# Patient Record
Sex: Male | Born: 1990 | Race: White | Hispanic: No | Smoking: Former smoker
Health system: Southern US, Community
[De-identification: ages and names within clinical notes are randomized; demographics above are authoritative.]

## PROBLEM LIST (undated history)

## (undated) DIAGNOSIS — T7840XA Allergy, unspecified, initial encounter: Secondary | ICD-10-CM

## (undated) DIAGNOSIS — J45909 Unspecified asthma, uncomplicated: Secondary | ICD-10-CM

## (undated) DIAGNOSIS — I10 Essential (primary) hypertension: Secondary | ICD-10-CM

## (undated) HISTORY — DX: Essential (primary) hypertension: I10

## (undated) HISTORY — DX: Allergy, unspecified, initial encounter: T78.40XA

## (undated) HISTORY — PX: FINGER SURGERY: SHX640

## (undated) HISTORY — DX: Unspecified asthma, uncomplicated: J45.909

---

## 2014-08-30 ENCOUNTER — Ambulatory Visit (INDEPENDENT_AMBULATORY_CARE_PROVIDER_SITE_OTHER): Payer: BLUE CROSS/BLUE SHIELD | Admitting: Urgent Care

## 2014-08-30 VITALS — BP 134/80 | HR 74 | Temp 98.5°F | Resp 16 | Ht 70.5 in | Wt 255.2 lb

## 2014-08-30 DIAGNOSIS — L550 Sunburn of first degree: Secondary | ICD-10-CM | POA: Diagnosis not present

## 2014-08-30 DIAGNOSIS — R208 Other disturbances of skin sensation: Secondary | ICD-10-CM | POA: Diagnosis not present

## 2014-08-30 DIAGNOSIS — L551 Sunburn of second degree: Secondary | ICD-10-CM | POA: Diagnosis not present

## 2014-08-30 NOTE — Patient Instructions (Signed)
Sunburn Sunburn is damage to the skin caused by overexposure to ultraviolet (UV) rays. People with light skin or a fair complexion may be more susceptible to sunburn. Repeated sun exposure causes early skin aging such as wrinkles and sun spots. It also increases the risk of skin cancer. CAUSES A sunburn is caused by getting too much UV radiation from the sun. SYMPTOMS  Red or pink skin.  Soreness and swelling.  Pain.  Blisters.  Peeling skin.  Headache, fever, and fatigue if sunburn covers a large area. TREATMENT  Your caregiver may tell you to take certain medicines to lessen inflammation.  Your caregiver may have you use hydrocortisone cream or spray to help with itching and inflammation.  Your caregiver may prescribe an antibiotic cream to use on blisters. HOME CARE INSTRUCTIONS   Avoid further exposure to the sun.  Cool baths and cool compresses may be helpful if used several times per day. Do not apply ice, since this may result in more damage to the skin.  Only take over-the-counter or prescription medicines for pain, discomfort, or fever as directed by your caregiver.  Use aloe or other over-the-counter sunburn creams or gels on your skin. Do not apply these creams or gels on blisters.  Drink enough fluids to keep your urine clear or pale yellow.  Do not break blisters. If blisters break, your caregiver may recommend an antibiotic cream to apply to the affected area. PREVENTION   Try to avoid the sun between 10:00 a.m. and 4:00 p.m. when it is the strongest.  Apply sunscreen at least 30 minutes before exposure to the sun.  Always wear protective hats, clothing, and sunglasses with UV protection.  Avoid medicines, herbs, and foods that increase your sensitivity to sunlight.  Avoid tanning beds. SEEK IMMEDIATE MEDICAL CARE IF:   You have a fever.  Your pain is uncontrolled with medicine.  You start to vomit or have diarrhea.  You feel faint or develop a  headache with confusion.  You develop severe blistering.  You have a pus-like (purulent) discharge coming from the blisters.  Your burn becomes more painful and swollen. MAKE SURE YOU:  Understand these instructions.  Will watch your condition.  Will get help right away if you are not doing well or get worse. Document Released: 12/12/2004 Document Revised: 06/29/2012 Document Reviewed: 08/26/2010 [employer] Patient Information 2015 [employer]. This information is not intended to replace advice given to you by your health care provider. Make sure you discuss any questions you have with your health care provider.  

## 2014-08-30 NOTE — Progress Notes (Signed)
    MRN: [id number] DOB: 01/23/1991  Subjective:   Antonio Kelley is a 24 y.o. male presenting for chief complaint of Sunburn  Reports 4 day history of sunburn. [place] and did not use sun block. Has tried aloe vera with minimal relief. Denies fevers, blisters, bleeding, chills, numbness, tingling, decreased range of motion. Patient has not suffered a sunburn like this previously. Denies any other aggravating or relieving factors, no other questions or concerns.  Wilgus currently has no medications in their medication list. He has No Known Allergies.  Tyrees  has a past medical history of Hypertension. Also  has past surgical history that includes Finger surgery.  ROS As in subjective.  Objective:   Vitals: BP 134/80 mmHg  Pulse 74  Temp(Src) 98.5 F (36.9 C) (Oral)  Resp 16  Ht 5' 10.5" (1.791 m)  Wt 255 lb 4 oz (115.781 kg)  BMI 36.09 kg/m2  SpO2 99%  Physical Exam  Constitutional: He is oriented to person, place, and time. He appears well-developed and well-nourished.  Cardiovascular: Normal rate.   Pulmonary/Chest: Effort normal.  Neurological: He is alert and oriented to person, place, and time.  Skin:       Assessment and Plan :   1. Sunburn, blistering 2. Sunburn of first degree 3. Pain of skin - Applied sulfadiazine to affect areas, recommended that patient continue to use cooled aloe vera. Also counseled patient on a first degree sunburn. Advised that he not remove any blistering that may still occur. Followup as needed.  Wallis Bamberg, PA-C [facility] [phone] 08/30/2014 1:48 PM

## 2014-11-03 ENCOUNTER — Ambulatory Visit (INDEPENDENT_AMBULATORY_CARE_PROVIDER_SITE_OTHER): Payer: BLUE CROSS/BLUE SHIELD

## 2014-11-03 ENCOUNTER — Ambulatory Visit (INDEPENDENT_AMBULATORY_CARE_PROVIDER_SITE_OTHER): Payer: BLUE CROSS/BLUE SHIELD | Admitting: Family Medicine

## 2014-11-03 VITALS — BP 138/84 | HR 94 | Temp 98.1°F | Resp 18 | Ht 74.0 in | Wt 253.0 lb

## 2014-11-03 DIAGNOSIS — R1013 Epigastric pain: Secondary | ICD-10-CM | POA: Diagnosis not present

## 2014-11-03 DIAGNOSIS — M25531 Pain in right wrist: Secondary | ICD-10-CM

## 2014-11-03 DIAGNOSIS — K219 Gastro-esophageal reflux disease without esophagitis: Secondary | ICD-10-CM | POA: Diagnosis not present

## 2014-11-03 LAB — POCT CBC
Granulocyte percent: 61.5 %G (ref 37–80)
HEMATOCRIT: 49.9 % (ref 43.5–53.7)
HEMOGLOBIN: 15.9 g/dL (ref 14.1–18.1)
LYMPH, POC: 3.1 (ref 0.6–3.4)
MCH: 27.3 pg (ref 27–31.2)
MCHC: 31.9 g/dL (ref 31.8–35.4)
MCV: 85.7 fL (ref 80–97)
MID (cbc): 1 — AB (ref 0–0.9)
MPV: 8.5 fL (ref 0–99.8)
POC GRANULOCYTE: 6.6 (ref 2–6.9)
POC LYMPH PERCENT: 28.9 %L (ref 10–50)
POC MID %: 9.6 % (ref 0–12)
Platelet Count, POC: 218 10*3/uL (ref 142–424)
RBC: 5.83 M/uL (ref 4.69–6.13)
RDW, POC: 13.5 %
WBC: 10.7 10*3/uL — AB (ref 4.6–10.2)

## 2014-11-03 MED ORDER — DICLOFENAC SODIUM 75 MG PO TBEC: 75.0000 mg | DELAYED_RELEASE_TABLET | Freq: Two times a day (BID) | ORAL | Status: DC

## 2014-11-03 MED ORDER — PANTOPRAZOLE SODIUM 40 MG PO TBEC: 40.0000 mg | DELAYED_RELEASE_TABLET | Freq: Every day | ORAL | Status: DC

## 2014-11-03 NOTE — Progress Notes (Signed)
  Subjective:  Patient ID: Antonio Kelley, male    DOB: 1990-07-15  Age: 24 y.o. MRN: [id number]  Has been hurting for the past couple of weeks in his right wrist. Nose major injuries. He does a lot of lifting of furniture.  Patient is also been having a chronic problem with dyspepsia. He's not been on any long-term regular medication. He has never been treated for H. pylori. He hurts in his epigastrium and has reflux nighttime. Very little relief with taking Tums occasionally.  Objective:   Pleasant gentleman in no major distress. Mild tenderness in the lateral/ulnar aspect of the right wrist.  [facility] reading (PRIMARY) by  Dr. Alwyn Ren Normal wrist.  .  Chest clear. Heart regular without murmurs. Abdomen soft without mass or tenderness    Assessment & Plan:   Assessment: Chronic dyspepsia, rule out H. pylori Wrist pain, probably an overuse strain.   Plan:  Wear a wrist splint. Diclofenac twice daily Pantoprazole 40 daily for stomach Return if not improving  Patient Instructions  Take diclofenac one twice daily with breakfast and supper for inflammation in wrist  Wear the wrist splint most of the time for several weeks and see if just resting it will not allow it to heal back up. If not improving we may need to send you to an orthopedist for a second opinion  Take the pantoprazole one daily for stomach, best taken before bedtime  If stomach keeps bothering you please return and we will decide on sending you elsewhere for a scoping  If the H. pylori test comes back positive you'll be given a couple of additional medications for about 10 days  Return as needed       Zade Falkner, MD 11/03/2014

## 2014-11-03 NOTE — Patient Instructions (Signed)
Take diclofenac one twice daily with breakfast and supper for inflammation in wrist  Wear the wrist splint most of the time for several weeks and see if just resting it will not allow it to heal back up. If not improving we may need to send you to an orthopedist for a second opinion  Take the pantoprazole one daily for stomach, best taken before bedtime  If stomach keeps bothering you please return and we will decide on sending you elsewhere for a scoping  If the H. pylori test comes back positive you'll be given a couple of additional medications for about 10 days  Return as needed

## 2014-11-04 LAB — H. PYLORI BREATH TEST: H. pylori Breath Test: DETECTED — AB

## 2014-11-10 ENCOUNTER — Other Ambulatory Visit: Payer: Self-pay | Admitting: Family Medicine

## 2014-11-10 MED ORDER — AMOXICILLIN 500 MG PO CAPS: ORAL_CAPSULE | ORAL | Status: DC

## 2014-11-10 MED ORDER — CLARITHROMYCIN ER 500 MG PO TB24: ORAL_TABLET | ORAL | Status: DC

## 2014-11-24 ENCOUNTER — Ambulatory Visit (INDEPENDENT_AMBULATORY_CARE_PROVIDER_SITE_OTHER): Payer: BLUE CROSS/BLUE SHIELD

## 2014-11-24 ENCOUNTER — Ambulatory Visit (INDEPENDENT_AMBULATORY_CARE_PROVIDER_SITE_OTHER): Payer: BLUE CROSS/BLUE SHIELD | Admitting: Family Medicine

## 2014-11-24 VITALS — BP 120/80 | HR 90 | Temp 98.0°F | Resp 18 | Ht 72.0 in | Wt 254.0 lb

## 2014-11-24 DIAGNOSIS — R05 Cough: Secondary | ICD-10-CM | POA: Diagnosis not present

## 2014-11-24 DIAGNOSIS — R062 Wheezing: Secondary | ICD-10-CM

## 2014-11-24 DIAGNOSIS — J4521 Mild intermittent asthma with (acute) exacerbation: Secondary | ICD-10-CM | POA: Diagnosis not present

## 2014-11-24 DIAGNOSIS — R059 Cough, unspecified: Secondary | ICD-10-CM

## 2014-11-24 DIAGNOSIS — K219 Gastro-esophageal reflux disease without esophagitis: Secondary | ICD-10-CM

## 2014-11-24 DIAGNOSIS — J309 Allergic rhinitis, unspecified: Secondary | ICD-10-CM | POA: Diagnosis not present

## 2014-11-24 MED ORDER — PANTOPRAZOLE SODIUM 40 MG PO TBEC: 40.0000 mg | DELAYED_RELEASE_TABLET | Freq: Every day | ORAL | Status: DC

## 2014-11-24 MED ORDER — ALBUTEROL SULFATE (2.5 MG/3ML) 0.083% IN NEBU
2.5000 mg | INHALATION_SOLUTION | Freq: Once | RESPIRATORY_TRACT | Status: AC
  Administered 2014-11-24: 2.5 mg via RESPIRATORY_TRACT

## 2014-11-24 MED ORDER — MOMETASONE FUROATE 100 MCG/ACT IN AERO: 2.0000 | INHALATION_SPRAY | Freq: Two times a day (BID) | RESPIRATORY_TRACT | Status: DC

## 2014-11-24 MED ORDER — IPRATROPIUM BROMIDE 0.02 % IN SOLN
0.5000 mg | Freq: Once | RESPIRATORY_TRACT | Status: AC
  Administered 2014-11-24: 0.5 mg via RESPIRATORY_TRACT

## 2014-11-24 MED ORDER — ALBUTEROL SULFATE 108 (90 BASE) MCG/ACT IN AEPB: 1.0000 | INHALATION_SPRAY | RESPIRATORY_TRACT | Status: DC | PRN

## 2014-11-24 NOTE — Progress Notes (Signed)
Subjective:  This chart was scribed for Antonio Post, MD by Broadus John, Medical Scribe. This patient was seen in Room 2 and the patient's care was started at 9:38 AM.   Patient ID: Antonio Kelley, male    DOB: 11-Jul-1990, 24 y.o.   MRN: [id number]  Chief Complaint  Patient presents with  . Shortness of Breath    x 2 weeks  . Wheezing    x 2 weeks  . Cough    x 2weeks   HPI HPI Comments: Antonio Kelley is a 24 y.o. male who presents to [facility] complaining of a worsening shortness of breath, gradual onset 2 weeks ago.   Pt reports associated symptoms of wheezing, unproductive cough.  Pt has not tried any OTC medications for the symptoms. He indicates that he has not has any new contacts with new pets, or in sick environment. Pt denies fever, diaphoresis, and sore throat. Pt reports a past medical history of Asthma while playing sports for which he used an inhaler, however he has not had the need to use it since high school. Pt reports having a history of allergies that result of constant symptoms of rhinorrhea.  Pt was seen here on 12/04/14 for heart burn symptoms for which he tested positive for H Pylori. Pt was started on Amoxicillin and Biaxin, increase protonix to BID for 10 days. Pt reports that he has had minimal heart burn since finishing the course of protonix, which he reports his worsening wheezing symptoms might has been associated with it.   Pt works at [employer].    There are no active problems to display for this patient.  Past Medical History  Diagnosis Date  . Hypertension   . Allergy    Past Surgical History  Procedure Laterality Date  . Finger surgery     No Known Allergies Prior to Admission medications   Medication Sig Start Date End Date Taking? Authorizing Provider  amoxicillin (AMOXIL) 500 MG capsule 2 pills twice daily Patient not taking: Reported on 11/24/2014 11/10/14   Peyton Najjar, MD  clarithromycin (BIAXIN XL)  500 MG 24 hr tablet 1 pill twice daily Patient not taking: Reported on 11/24/2014 11/10/14   Peyton Najjar, MD  diclofenac (VOLTAREN) 75 MG EC tablet Take 1 tablet (75 mg total) by mouth 2 (two) times daily. Patient not taking: Reported on 11/24/2014 11/03/14   Peyton Najjar, MD  pantoprazole (PROTONIX) 40 MG tablet Take 1 tablet (40 mg total) by mouth daily. Patient not taking: Reported on 11/24/2014 11/03/14   Peyton Najjar, MD   Social History   Social History  . Marital Status: Single    Spouse Name: N/A  . Number of Children: N/A  . Years of Education: N/A   Occupational History  . Not on file.   Social History Main Topics  . Smoking status: Former Games developer  . Smokeless tobacco: Former Neurosurgeon  . Alcohol Use: No  . Drug Use: No  . Sexual Activity: Not on file   Other Topics Concern  . Not on file   Social History Narrative    Review of Systems  Constitutional: Negative for fever and diaphoresis.  HENT: Positive for rhinorrhea. Negative for sore throat.   Respiratory: Positive for cough, shortness of breath and wheezing.       Objective:   Physical Exam  Constitutional: He is oriented to person, place, and time. He appears well-developed and well-nourished. No distress.  HENT:  Head: Normocephalic and atraumatic.  Eyes: EOM are normal. Pupils are equal, round, and reactive to light.  Neck: Neck supple. No JVD present. Carotid bruit is not present.  Cardiovascular: Normal rate, regular rhythm and normal heart sounds.   No murmur heard. Pulmonary/Chest: Effort normal. No respiratory distress. He has wheezes (Diffuses wheeze on expiration, slight on inspiration.). He has no rales.  Normal effort.   Musculoskeletal: He exhibits no edema.  Neurological: He is alert and oriented to person, place, and time. No cranial nerve deficit.  Skin: Skin is warm and dry.  Psychiatric: He has a normal mood and affect. His behavior is normal.  Nursing note and vitals reviewed.   Filed  Vitals:   11/24/14 0925  BP: 120/80  Pulse: 90  Temp: 98 F (36.7 C)  TempSrc: Oral  Resp: 18  Height: 6' (1.829 m)  Weight: 254 lb (115.214 kg)  SpO2: 97%   Peak flow reading is 350, about 55 % of predicted (after predicted 636).  10:14 AM- After albuterol nep treatment, slight improvement in breathing, but still wheezing inspiratory and expiratory.  Peak flow was after first nep treatment was 450.  11:19 AM- Second albuterol and atrovent nep given, had improved breathing but still diffused wheeze. Repeat albuterol and atrovent nep given, he has improved aeration, but still slight on expiratory wheeze, but normal effort. Peak flow is 590.     [facility] (PRIMARY) x-ray report read by Dr. Trinna Kelley- no acute disease.       Assessment & Plan:   [name] is a 24 y.o. male Gastroesophageal reflux disease without esophagitis - Plan: pantoprazole (PROTONIX) 40 MG tablet  Wheezing - Plan: albuterol (PROVENTIL) (2.5 MG/3ML) 0.083% nebulizer solution 2.5 mg, DG Chest 2 View  Cough - Plan: DG Chest 2 View  Allergic rhinitis, unspecified allergic rhinitis type  Asthma, intermittent, with acute exacerbation - Plan: albuterol (PROVENTIL) (2.5 MG/3ML) 0.083% nebulizer solution 2.5 mg, albuterol (PROVENTIL) (2.5 MG/3ML) 0.083% nebulizer solution 2.5 mg, ipratropium (ATROVENT) nebulizer solution 0.5 mg, albuterol (PROVENTIL) (2.5 MG/3ML) 0.083% nebulizer solution 2.5 mg, ipratropium (ATROVENT) nebulizer solution 0.5 mg, DG Chest 2 View, Albuterol Sulfate (PROAIR RESPICLICK) 108 (90 BASE) MCG/ACT AEPB, Mometasone Furoate (ASMANEX HFA) 100 MCG/ACT AERO   -Suspected underlying asthma, with recent wheezing. Improved with nebulizer treatments in office. Start Asmanex twice a day, albuterol at home if needed. If increased need for albuterol, or worsening symptoms in the next few days, return to clinic or emergency room. Held on prednisone taper at this point as may be awakened by with just inhaled  corticosteroid. RTC precautions discussed.  Additionally discuss continuing Protonix for possible GERD component, and over-the-counter antihistamine for allergy symptoms.  Meds ordered this encounter  Medications  . pantoprazole (PROTONIX) 40 MG tablet    Sig: Take 1 tablet (40 mg total) by mouth daily.    Dispense:  30 tablet    Refill:  1  . albuterol (PROVENTIL) (2.5 MG/3ML) 0.083% nebulizer solution 2.5 mg    Sig:   . albuterol (PROVENTIL) (2.5 MG/3ML) 0.083% nebulizer solution 2.5 mg    Sig:   . ipratropium (ATROVENT) nebulizer solution 0.5 mg    Sig:   . albuterol (PROVENTIL) (2.5 MG/3ML) 0.083% nebulizer solution 2.5 mg    Sig:   . ipratropium (ATROVENT) nebulizer solution 0.5 mg    Sig:   . Albuterol Sulfate (PROAIR RESPICLICK) 108 (90 BASE) MCG/ACT AEPB    Sig: Inhale 1-2 puffs into the lungs every 4 (  four) hours as needed.    Dispense:  1 each    Refill:  0  . Mometasone Furoate (ASMANEX HFA) 100 MCG/ACT AERO    Sig: Inhale 2 puffs into the lungs 2 (two) times daily.    Dispense:  13 g    Refill:  0   Patient Instructions  You're breathing has significantly improved here in the office with the albuterol and Atrovent nebs. I wrote for a similar medication called pro-air. You can use 1-2 puffs every 4 hours as needed over the next few days, if you're still using this frequently into this weekend, follow up with me on Saturday or Sunday. We are open from 8 to for both days. Additionally start the Asmanex inhaler 2 puffs every 12 hours. If wheezing is not improving in the next 2 days, we may need to start prednisone by mouth, but can see if the Asmanex will treat her symptoms initially. If you are having any worsening of your symptoms, or requiring albuterol inhaler more frequent every 4 hours, return here or the emergency room.  Continue over-the-counter Claritin, Allegra, or Zyrtec for allergies, and I sent in more of your Protonix to treat the acid reflux symptoms. Reflux can  also contribute to wheezing, so continue the acid blocker for now. Follow-up with myself or other provider in next 2-3 weeks to see how both the reflux and your wheezing symptoms have been doing.  Return to the clinic or go to the nearest emergency room if any of your symptoms worsen or new symptoms occur.  Bronchospasm A bronchospasm is when the tubes that carry air in and out of your lungs (airways) spasm or tighten. During a bronchospasm it is hard to breathe. This is because the airways get smaller. A bronchospasm can be triggered by:  Allergies. These may be to animals, pollen, food, or mold.  Infection. This is a common cause of bronchospasm.  Exercise.  Irritants. These include pollution, cigarette smoke, strong odors, aerosol sprays, and paint fumes.  Weather changes.  Stress.  Being emotional. HOME CARE   Always have a plan for getting help. Know when to call your doctor and local emergency services (911 in the [place] Know where you can get emergency care.  Only take medicines as told by your doctor.  If you were prescribed an inhaler or nebulizer machine, ask your doctor how to use it correctly. Always use a spacer with your inhaler if you were given one.  Stay calm during an attack. Try to relax and breathe more slowly.  Control your home environment:  Change your heating and air conditioning filter at least once a month.  Limit your use of fireplaces and wood stoves.  Do not  smoke. Do not  allow smoking in your home.  Avoid perfumes and fragrances.  Get rid of pests (such as roaches and mice) and their droppings.  Throw away plants if you see mold on them.  Keep your house clean and dust free.  Replace carpet with wood, tile, or vinyl flooring. Carpet can trap dander and dust.  Use allergy-proof pillows, mattress covers, and box spring covers.  Wash bed sheets and blankets every week in hot water. Dry them in a dryer.  Use blankets that are made of  polyester or cotton.  Wash hands frequently. GET HELP IF:  You have muscle aches.  You have chest pain.  The thick spit you spit or cough up (sputum) changes from clear or white to yellow, green,  gray, or bloody.  The thick spit you spit or cough up gets thicker.  There are problems that may be related to the medicine you are given such as:  A rash.  Itching.  Swelling.  Trouble breathing. GET HELP RIGHT AWAY IF:  You feel you cannot breathe or catch your breath.  You cannot stop coughing.  Your treatment is not helping you breathe better.  You have very bad chest pain. MAKE SURE YOU:   Understand these instructions.  Will watch your condition.  Will get help right away if you are not doing well or get worse. Document Released: 12/30/2008 Document Revised: 03/09/2013 Document Reviewed: 08/25/2012 [facility] Patient Information 2015 [place], [place]. This information is not intended to replace advice given to you by your health care provider. Make sure you discuss any questions you have with your health care provider.   Asthma, Acute Bronchospasm Acute bronchospasm caused by asthma is also referred to as an asthma attack. Bronchospasm means your air passages become narrowed. The narrowing is caused by inflammation and tightening of the muscles in the air tubes (bronchi) in your lungs. This can make it hard to breathe or cause you to wheeze and cough. CAUSES Possible triggers are:  Animal dander from the skin, hair, or feathers of animals.  Dust mites contained in house dust.  Cockroaches.  Pollen from trees or grass.  Mold.  Cigarette or tobacco smoke.  Air pollutants such as dust, household cleaners, hair sprays, aerosol sprays, paint fumes, strong chemicals, or strong odors.  Cold air or weather changes. Cold air may trigger inflammation. Winds increase molds and pollens in the air.  Strong emotions such as crying or laughing hard.  Stress.  Certain  medicines such as aspirin or beta-blockers.  Sulfites in foods and drinks, such as dried fruits and wine.  Infections or inflammatory conditions, such as a flu, cold, or inflammation of the nasal membranes (rhinitis).  Gastroesophageal reflux disease (GERD). GERD is a condition where stomach acid backs up into your esophagus.  Exercise or strenuous activity. SIGNS AND SYMPTOMS   Wheezing.  Excessive coughing, particularly at night.  Chest tightness.  Shortness of breath. DIAGNOSIS  Your health care provider will ask you about your medical history and perform a physical exam. A chest X-ray or blood testing may be performed to look for other causes of your symptoms or other conditions that may have triggered your asthma attack. TREATMENT  Treatment is aimed at reducing inflammation and opening up the airways in your lungs. Most asthma attacks are treated with inhaled medicines. These include quick relief or rescue medicines (such as bronchodilators) and controller medicines (such as inhaled corticosteroids). These medicines are sometimes given through an inhaler or a nebulizer. Systemic steroid medicine taken by mouth or given through an IV tube also can be used to reduce the inflammation when an attack is moderate or severe. Antibiotic medicines are only used if a bacterial infection is present.  HOME CARE INSTRUCTIONS   Rest.  Drink plenty of liquids. This helps the mucus to remain thin and be easily coughed up. Only use caffeine in moderation and do not use alcohol until you have recovered from your illness.  Do not smoke. Avoid being exposed to secondhand smoke.  You play a critical role in keeping yourself in good health. Avoid exposure to things that cause you to wheeze or to have breathing problems.  Keep your medicines up-to-date and available. Carefully follow your health care provider's treatment plan.  Take  your medicine exactly as prescribed.  When pollen or pollution  is bad, keep windows closed and use an air conditioner or go to places with air conditioning.  Asthma requires careful medical care. See your health care provider for a follow-up as advised. If you are more than [redacted] weeks pregnant and you were prescribed any new medicines, let your obstetrician know about the visit and how you are doing. Follow up with your health care provider as directed.  After you have recovered from your asthma attack, make an appointment with your outpatient doctor to talk about ways to reduce the likelihood of future attacks. If you do not have a doctor who manages your asthma, make an appointment with a primary care doctor to discuss your asthma. SEEK IMMEDIATE MEDICAL CARE IF:   You are getting worse.  You have trouble breathing. If severe, call your local emergency services (911 in the [place]  You develop chest pain or discomfort.  You are vomiting.  You are not able to keep fluids down.  You are coughing up yellow, green, brown, or bloody sputum.  You have a fever and your symptoms suddenly get worse.  You have trouble swallowing. MAKE SURE YOU:   Understand these instructions.  Will watch your condition.  Will get help right away if you are not doing well or get worse. Document Released: 06/19/2006 Document Revised: 03/09/2013 Document Reviewed: 09/09/2012 [facility] Patient Information 2015 [place], [place]. This information is not intended to replace advice given to you by your health care provider. Make sure you discuss any questions you have with your health care provider.     I personally performed the services described in this documentation, which was scribed in my presence. The recorded information has been reviewed and considered, and addended by me as needed.

## 2014-11-24 NOTE — Patient Instructions (Signed)
You're breathing has significantly improved here in the office with the albuterol and Atrovent nebs. I wrote for a similar medication called pro-air. You can use 1-2 puffs every 4 hours as needed over the next few days, if you're still using this frequently into this weekend, follow up with me on Saturday or Sunday. We are open from 8 to for both days. Additionally start the Asmanex inhaler 2 puffs every 12 hours. If wheezing is not improving in the next 2 days, we may need to start prednisone by mouth, but can see if the Asmanex will treat her symptoms initially. If you are having any worsening of your symptoms, or requiring albuterol inhaler more frequent every 4 hours, return here or the emergency room.  Continue over-the-counter Claritin, Allegra, or Zyrtec for allergies, and I sent in more of your Protonix to treat the acid reflux symptoms. Reflux can also contribute to wheezing, so continue the acid blocker for now. Follow-up with myself or other provider in next 2-3 weeks to see how both the reflux and your wheezing symptoms have been doing.  Return to the clinic or go to the nearest emergency room if any of your symptoms worsen or new symptoms occur.  Bronchospasm A bronchospasm is when the tubes that carry air in and out of your lungs (airways) spasm or tighten. During a bronchospasm it is hard to breathe. This is because the airways get smaller. A bronchospasm can be triggered by:  Allergies. These may be to animals, pollen, food, or mold.  Infection. This is a common cause of bronchospasm.  Exercise.  Irritants. These include pollution, cigarette smoke, strong odors, aerosol sprays, and paint fumes.  Weather changes.  Stress.  Being emotional. HOME CARE   Always have a plan for getting help. Know when to call your doctor and local emergency services (911 in the [place] Know where you can get emergency care.  Only take medicines as told by your doctor.  If you were prescribed an  inhaler or nebulizer machine, ask your doctor how to use it correctly. Always use a spacer with your inhaler if you were given one.  Stay calm during an attack. Try to relax and breathe more slowly.  Control your home environment:  Change your heating and air conditioning filter at least once a month.  Limit your use of fireplaces and wood stoves.  Do not  smoke. Do not  allow smoking in your home.  Avoid perfumes and fragrances.  Get rid of pests (such as roaches and mice) and their droppings.  Throw away plants if you see mold on them.  Keep your house clean and dust free.  Replace carpet with wood, tile, or vinyl flooring. Carpet can trap dander and dust.  Use allergy-proof pillows, mattress covers, and box spring covers.  Wash bed sheets and blankets every week in hot water. Dry them in a dryer.  Use blankets that are made of polyester or cotton.  Wash hands frequently. GET HELP IF:  You have muscle aches.  You have chest pain.  The thick spit you spit or cough up (sputum) changes from clear or white to yellow, green, gray, or bloody.  The thick spit you spit or cough up gets thicker.  There are problems that may be related to the medicine you are given such as:  A rash.  Itching.  Swelling.  Trouble breathing. GET HELP RIGHT AWAY IF:  You feel you cannot breathe or catch your breath.  You cannot stop  coughing.  Your treatment is not helping you breathe better.  You have very bad chest pain. MAKE SURE YOU:   Understand these instructions.  Will watch your condition.  Will get help right away if you are not doing well or get worse. Document Released: 12/30/2008 Document Revised: 03/09/2013 Document Reviewed: 08/25/2012 [facility] Patient Information 2015 [place], [place]. This information is not intended to replace advice given to you by your health care provider. Make sure you discuss any questions you have with your health care provider.   Asthma,  Acute Bronchospasm Acute bronchospasm caused by asthma is also referred to as an asthma attack. Bronchospasm means your air passages become narrowed. The narrowing is caused by inflammation and tightening of the muscles in the air tubes (bronchi) in your lungs. This can make it hard to breathe or cause you to wheeze and cough. CAUSES Possible triggers are:  Animal dander from the skin, hair, or feathers of animals.  Dust mites contained in house dust.  Cockroaches.  Pollen from trees or grass.  Mold.  Cigarette or tobacco smoke.  Air pollutants such as dust, household cleaners, hair sprays, aerosol sprays, paint fumes, strong chemicals, or strong odors.  Cold air or weather changes. Cold air may trigger inflammation. Winds increase molds and pollens in the air.  Strong emotions such as crying or laughing hard.  Stress.  Certain medicines such as aspirin or beta-blockers.  Sulfites in foods and drinks, such as dried fruits and wine.  Infections or inflammatory conditions, such as a flu, cold, or inflammation of the nasal membranes (rhinitis).  Gastroesophageal reflux disease (GERD). GERD is a condition where stomach acid backs up into your esophagus.  Exercise or strenuous activity. SIGNS AND SYMPTOMS   Wheezing.  Excessive coughing, particularly at night.  Chest tightness.  Shortness of breath. DIAGNOSIS  Your health care provider will ask you about your medical history and perform a physical exam. A chest X-ray or blood testing may be performed to look for other causes of your symptoms or other conditions that may have triggered your asthma attack. TREATMENT  Treatment is aimed at reducing inflammation and opening up the airways in your lungs. Most asthma attacks are treated with inhaled medicines. These include quick relief or rescue medicines (such as bronchodilators) and controller medicines (such as inhaled corticosteroids). These medicines are sometimes given  through an inhaler or a nebulizer. Systemic steroid medicine taken by mouth or given through an IV tube also can be used to reduce the inflammation when an attack is moderate or severe. Antibiotic medicines are only used if a bacterial infection is present.  HOME CARE INSTRUCTIONS   Rest.  Drink plenty of liquids. This helps the mucus to remain thin and be easily coughed up. Only use caffeine in moderation and do not use alcohol until you have recovered from your illness.  Do not smoke. Avoid being exposed to secondhand smoke.  You play a critical role in keeping yourself in good health. Avoid exposure to things that cause you to wheeze or to have breathing problems.  Keep your medicines up-to-date and available. Carefully follow your health care provider's treatment plan.  Take your medicine exactly as prescribed.  When pollen or pollution is bad, keep windows closed and use an air conditioner or go to places with air conditioning.  Asthma requires careful medical care. See your health care provider for a follow-up as advised. If you are more than [redacted] weeks pregnant and you were prescribed any new medicines,  let your obstetrician know about the visit and how you are doing. Follow up with your health care provider as directed.  After you have recovered from your asthma attack, make an appointment with your outpatient doctor to talk about ways to reduce the likelihood of future attacks. If you do not have a doctor who manages your asthma, make an appointment with a primary care doctor to discuss your asthma. SEEK IMMEDIATE MEDICAL CARE IF:   You are getting worse.  You have trouble breathing. If severe, call your local emergency services (911 in the [place]  You develop chest pain or discomfort.  You are vomiting.  You are not able to keep fluids down.  You are coughing up yellow, green, brown, or bloody sputum.  You have a fever and your symptoms suddenly get worse.  You have  trouble swallowing. MAKE SURE YOU:   Understand these instructions.  Will watch your condition.  Will get help right away if you are not doing well or get worse. Document Released: 06/19/2006 Document Revised: 03/09/2013 Document Reviewed: 09/09/2012 [facility] Patient Information 2015 [place], [place]. This information is not intended to replace advice given to you by your health care provider. Make sure you discuss any questions you have with your health care provider.

## 2014-12-28 ENCOUNTER — Encounter: Payer: Self-pay | Admitting: Physician Assistant

## 2014-12-28 ENCOUNTER — Ambulatory Visit (INDEPENDENT_AMBULATORY_CARE_PROVIDER_SITE_OTHER): Payer: BLUE CROSS/BLUE SHIELD | Admitting: Physician Assistant

## 2014-12-28 VITALS — BP 143/89 | HR 98 | Temp 98.0°F | Resp 18 | Ht 72.0 in | Wt 250.0 lb

## 2014-12-28 DIAGNOSIS — J4521 Mild intermittent asthma with (acute) exacerbation: Secondary | ICD-10-CM

## 2014-12-28 DIAGNOSIS — J45901 Unspecified asthma with (acute) exacerbation: Secondary | ICD-10-CM

## 2014-12-28 LAB — POCT GLYCOSYLATED HEMOGLOBIN (HGB A1C): HEMOGLOBIN A1C: 5.4

## 2014-12-28 LAB — GLUCOSE, POCT (MANUAL RESULT ENTRY): POC GLUCOSE: 85 mg/dL (ref 70–99)

## 2014-12-28 MED ORDER — MOMETASONE FUROATE 100 MCG/ACT IN AERO: 2.0000 | INHALATION_SPRAY | Freq: Two times a day (BID) | RESPIRATORY_TRACT | Status: DC

## 2014-12-28 MED ORDER — PREDNISONE 20 MG PO TABS: 40.0000 mg | ORAL_TABLET | Freq: Every day | ORAL | Status: DC

## 2014-12-28 MED ORDER — IPRATROPIUM-ALBUTEROL 18-103 MCG/ACT IN AERO: 1.0000 | INHALATION_SPRAY | Freq: Four times a day (QID) | RESPIRATORY_TRACT | Status: DC | PRN

## 2014-12-28 MED ORDER — DESLORATADINE 5 MG PO TBDP: 5.0000 mg | ORAL_TABLET | Freq: Every day | ORAL | Status: DC

## 2014-12-28 NOTE — Progress Notes (Signed)
12/28/2014 at 4:32 PM  Antonio Kelley / DOB: Apr 11, 1990 / MRN: [id number]  The patient  does not have a problem list on file.  SUBJECTIVE  Asthma He complains of chest tightness, cough, difficulty breathing and wheezing. There is no hemoptysis or sputum production. The current episode started more than 1 month ago. The problem has been waxing and waning. The cough is non-productive. Associated symptoms include nasal congestion and rhinorrhea. Pertinent negatives include no appetite change, chest pain, fever or headaches. His symptoms are aggravated by strenuous activity. His symptoms are alleviated by beta-agonist and steroid inhaler. He reports minimal improvement on treatment. His past medical history is significant for asthma.    He reports that he has used all of his rescue inhaler prescribed just over one month ago and he would like this refilled.    He  has a past medical history of Hypertension and Allergy.    Medications reviewed and updated by myself where necessary, and exist elsewhere in the encounter.   Antonio Kelley has No Known Allergies. He  reports that he has quit smoking. He has quit using smokeless tobacco. He reports that he does not drink alcohol or use illicit drugs. He  has no sexual activity history on file. The patient  has past surgical history that includes Finger surgery.  His family history includes Cancer in his maternal grandmother and mother.  Review of Systems  Constitutional: Negative for fever, chills and appetite change.  HENT: Positive for rhinorrhea.   Respiratory: Positive for cough and wheezing. Negative for hemoptysis and sputum production.   Cardiovascular: Negative for chest pain.  Gastrointestinal: Negative for nausea.  Genitourinary: Negative for dysuria.  Skin: Negative for rash.  Neurological: Negative for dizziness and headaches.    OBJECTIVE  His  height is 6' (1.829 m) and weight is 250 lb (113.399 kg). His temperature is 98 F (36.7  C). His blood pressure is 143/89 and his pulse is 98. His respiration is 18 and oxygen saturation is 98%.  The patient's body mass index is 33.9 kg/(m^2).  Physical Exam  Vitals reviewed. Constitutional: He is oriented to person, place, and time. He appears well-developed. No distress.  Eyes: EOM are normal. Pupils are equal, round, and reactive to light. No scleral icterus.  Neck: Normal range of motion.  Cardiovascular: Normal rate and regular rhythm.   Respiratory: Effort normal and breath sounds normal. No accessory muscle usage. No respiratory distress. He has no wheezes. He has no rales.  GI: He exhibits no distension.  Musculoskeletal: Normal range of motion.  Neurological: He is alert and oriented to person, place, and time. No cranial nerve deficit.  Skin: Skin is warm and dry. No rash noted. He is not diaphoretic.  Psychiatric: He has a normal mood and affect.    Results for orders placed or performed in visit on 12/28/14 (from the past 24 hour(s))  POCT glycosylated hemoglobin (Hb A1C)     Status: None   Collection Time: 12/28/14  4:31 PM  Result Value Ref Range   Hemoglobin A1C 5.4     ASSESSMENT & PLAN  Antonio Kelley was seen today for follow-up and asthma.  Diagnoses and all orders for this visit:  Reactive airway disease with acute exacerbation: Patient still symptomatic.  Prednisone 40 mg daily for five days for now.  Action plan in place for future episodes.   -     POCT glycosylated hemoglobin (Hb A1C) -     POCT glucose (manual entry) -  predniSONE (DELTASONE) 20 MG tablet; Take 2 tablets (40 mg total) by mouth daily with breakfast. -     albuterol-ipratropium (COMBIVENT) 18-103 MCG/ACT inhaler; Inhale 1-2 puffs into the lungs every 6 (six) hours as needed for wheezing or shortness of breath. -     desloratadine (CLARINEX REDITAB) 5 MG disintegrating tablet; Take 1 tablet (5 mg total) by mouth daily.     The patient was advised to call or come back to clinic if he  does not see an improvement in symptoms, or worsens with the above plan.   Deliah Boston, MHS, PA-C [facility] 12/28/2014 4:32 PM

## 2014-12-29 ENCOUNTER — Telehealth: Payer: Self-pay | Admitting: Physician Assistant

## 2014-12-29 ENCOUNTER — Other Ambulatory Visit: Payer: Self-pay | Admitting: Physician Assistant

## 2014-12-29 MED ORDER — ALBUTEROL SULFATE HFA 108 (90 BASE) MCG/ACT IN AERS: 2.0000 | INHALATION_SPRAY | RESPIRATORY_TRACT | Status: DC | PRN

## 2014-12-29 NOTE — Telephone Encounter (Signed)
Done

## 2014-12-29 NOTE — Telephone Encounter (Signed)
Ms. Adin HectorK. Johnson called on Pt's behalf, stating that insurance will not cover newly prescribed inhaler// Please re-write for ProAir.   [phone]

## 2014-12-29 NOTE — Telephone Encounter (Signed)
Okay to prescribe Pro-Air?

## 2015-01-20 ENCOUNTER — Telehealth: Payer: Self-pay

## 2015-01-20 ENCOUNTER — Ambulatory Visit (INDEPENDENT_AMBULATORY_CARE_PROVIDER_SITE_OTHER): Payer: BLUE CROSS/BLUE SHIELD | Admitting: Emergency Medicine

## 2015-01-20 VITALS — BP 124/72 | HR 81 | Temp 98.1°F | Resp 18 | Ht 72.0 in | Wt 259.0 lb

## 2015-01-20 DIAGNOSIS — J4521 Mild intermittent asthma with (acute) exacerbation: Secondary | ICD-10-CM | POA: Diagnosis not present

## 2015-01-20 MED ORDER — ALBUTEROL SULFATE 108 (90 BASE) MCG/ACT IN AEPB: 2.0000 | INHALATION_SPRAY | RESPIRATORY_TRACT | Status: DC | PRN

## 2015-01-20 MED ORDER — COMPRESSOR NEBULIZER MISC: 1.0000 | Freq: Four times a day (QID) | Status: DC

## 2015-01-20 MED ORDER — ALBUTEROL SULFATE (2.5 MG/3ML) 0.083% IN NEBU: 2.5000 mg | INHALATION_SOLUTION | Freq: Four times a day (QID) | RESPIRATORY_TRACT | Status: DC | PRN

## 2015-01-20 NOTE — Telephone Encounter (Signed)
Pt was seen last for shortness of breath and was given medication that they have not been able to get yet due to finances and he had to leave work early today due to shortness of breath and he now needs a work note does he need to come in or can this just be written   Best number [phone]

## 2015-01-20 NOTE — Patient Instructions (Signed)

## 2015-01-20 NOTE — Progress Notes (Signed)
Subjective:  Patient ID: Antonio Kelley, male    DOB: Apr 05, 1990  Age: 24 y.o. MRN: [id number]  CC: Asthma   HPI [name] presents  patient was at work today developed had an asthma attack he was forced to go home and rest. He used some Asmanex and said that really didn't you help his asthma excess abrasion improve. He's been out of albuterol and can't afford to buy a new inhaler due to cost. Denies any cough or sputum production fever or chills. No nasal congestion postnasal drainage or other complaints is currently asymptomatic  History Stephenson has a past medical history of Hypertension; Allergy; and Asthma.   He has past surgical history that includes Finger surgery.   His  family history includes Cancer in his maternal grandmother and mother.  He   reports that he has quit smoking. He has quit using smokeless tobacco. He reports that he does not drink alcohol or use illicit drugs.  Outpatient Prescriptions Prior to Visit  Medication Sig Dispense Refill  . Mometasone Furoate (ASMANEX HFA) 100 MCG/ACT AERO Inhale 2 puffs into the lungs 2 (two) times daily. Start this medication and take consistently (10 days) for chest tightness and cough. 13 g 3  . predniSONE (DELTASONE) 20 MG tablet Take 2 tablets (40 mg total) by mouth daily with breakfast. 10 tablet 0  . albuterol (PROVENTIL HFA;VENTOLIN HFA) 108 (90 BASE) MCG/ACT inhaler Inhale 2 puffs into the lungs every 4 (four) hours as needed for wheezing or shortness of breath (cough, shortness of breath or wheezing.). (Patient not taking: Reported on 01/20/2015) 1 Inhaler 1  . desloratadine (CLARINEX REDITAB) 5 MG disintegrating tablet Take 1 tablet (5 mg total) by mouth daily. (Patient not taking: Reported on 01/20/2015) 30 tablet 3   No facility-administered medications prior to visit.    Social History   Social History  . Marital Status: Single    Spouse Name: N/A  . Number of Children: N/A  . Years of Education: N/A   Social  History Main Topics  . Smoking status: Former Games developer  . Smokeless tobacco: Former Neurosurgeon  . Alcohol Use: No  . Drug Use: No  . Sexual Activity: Not Asked   Other Topics Concern  . None   Social History Narrative     Review of Systems  Constitutional: Negative for fever, chills and appetite change.  HENT: Negative for congestion, ear pain, postnasal drip, sinus pressure and sore throat.   Eyes: Negative for pain and redness.  Respiratory: Negative for cough, shortness of breath and wheezing.   Cardiovascular: Negative for leg swelling.  Gastrointestinal: Negative for nausea, vomiting, abdominal pain, diarrhea, constipation and blood in stool.  Endocrine: Negative for polyuria.  Genitourinary: Negative for dysuria, urgency, frequency and flank pain.  Musculoskeletal: Negative for gait problem.  Skin: Negative for rash.  Neurological: Negative for weakness and headaches.  Psychiatric/Behavioral: Negative for confusion and decreased concentration. The patient is not nervous/anxious.     Objective:  BP 124/72 mmHg  Pulse 81  Temp(Src) 98.1 F (36.7 C) (Oral)  Resp 18  Ht 6' (1.829 m)  Wt 259 lb (117.482 kg)  BMI 35.12 kg/m2  SpO2 98%  Physical Exam  Constitutional: He is oriented to person, place, and time. He appears well-developed and well-nourished. No distress.  HENT:  Head: Normocephalic and atraumatic.  Right Ear: External ear normal.  Left Ear: External ear normal.  Nose: Nose normal.  Eyes: Conjunctivae and EOM are normal. Pupils are equal, round,  and reactive to light. No scleral icterus.  Neck: Normal range of motion. Neck supple. No tracheal deviation present.  Cardiovascular: Normal rate, regular rhythm and normal heart sounds.   Pulmonary/Chest: Effort normal. No respiratory distress. He has no wheezes. He has no rales.  Abdominal: He exhibits no mass. There is no tenderness. There is no rebound and no guarding.  Musculoskeletal: He exhibits no edema.    Lymphadenopathy:    He has no cervical adenopathy.  Neurological: He is alert and oriented to person, place, and time. Coordination normal.  Skin: Skin is warm and dry. No rash noted.  Psychiatric: He has a normal mood and affect. His behavior is normal.      Assessment & Plan:   [name] was seen today for asthma.  Diagnoses and all orders for this visit:  Asthma, intermittent, with acute exacerbation  Other orders -     Nebulizers (COMPRESSOR NEBULIZER) MISC; 1 ampule by Does not apply route 4 (four) times daily. -     albuterol (PROVENTIL) (2.5 MG/3ML) 0.083% nebulizer solution; Take 3 mLs (2.5 mg total) by nebulization every 6 (six) hours as needed for wheezing or shortness of breath. -     Albuterol Sulfate (PROAIR RESPICLICK) 108 (90 BASE) MCG/ACT AEPB; Inhale 2 puffs into the lungs every 4 (four) hours as needed.   I am having Mr. Saltz start on Rite AidCompressor Nebulizer, albuterol, and Albuterol Sulfate. I am also having him maintain his predniSONE, desloratadine, Mometasone Furoate, and albuterol.  Meds ordered this encounter  Medications  . Nebulizers (COMPRESSOR NEBULIZER) MISC    Sig: 1 ampule by Does not apply route 4 (four) times daily.    Dispense:  1 each    Refill:  0  . albuterol (PROVENTIL) (2.5 MG/3ML) 0.083% nebulizer solution    Sig: Take 3 mLs (2.5 mg total) by nebulization every 6 (six) hours as needed for wheezing or shortness of breath.    Dispense:  150 mL    Refill:  1  . Albuterol Sulfate (PROAIR RESPICLICK) 108 (90 BASE) MCG/ACT AEPB    Sig: Inhale 2 puffs into the lungs every 4 (four) hours as needed.    Dispense:  1 each    Refill:  12    Appropriate red flag conditions were discussed with the patient as well as actions that should be taken.  Patient expressed his understanding.  Follow-up: Return if symptoms worsen or fail to improve.  Carmelina DaneAnderson, Unity Luepke S, MD

## 2015-01-21 NOTE — Telephone Encounter (Signed)
RTC. Was seen almost a month ago.

## 2015-01-22 NOTE — Telephone Encounter (Signed)
Left message to RTC.

## 2015-02-16 ENCOUNTER — Ambulatory Visit (INDEPENDENT_AMBULATORY_CARE_PROVIDER_SITE_OTHER): Payer: BLUE CROSS/BLUE SHIELD | Admitting: Emergency Medicine

## 2015-02-16 VITALS — BP 122/76 | HR 78 | Temp 98.1°F | Resp 16 | Ht 72.0 in | Wt 255.0 lb

## 2015-02-16 DIAGNOSIS — R1032 Left lower quadrant pain: Secondary | ICD-10-CM | POA: Diagnosis not present

## 2015-02-16 LAB — POCT URINALYSIS DIP (MANUAL ENTRY)
BILIRUBIN UA: NEGATIVE
Bilirubin, UA: NEGATIVE
Glucose, UA: NEGATIVE
Leukocytes, UA: NEGATIVE
Nitrite, UA: NEGATIVE
PH UA: 6
PROTEIN UA: NEGATIVE
RBC UA: NEGATIVE
SPEC GRAV UA: 1.025
Urobilinogen, UA: 0.2

## 2015-02-16 LAB — POCT CBC
GRANULOCYTE PERCENT: 57.8 % (ref 37–80)
HCT, POC: 45.5 % (ref 43.5–53.7)
HEMOGLOBIN: 15.8 g/dL (ref 14.1–18.1)
Lymph, poc: 3.2 (ref 0.6–3.4)
MCH, POC: 29.8 pg (ref 27–31.2)
MCHC: 34.8 g/dL (ref 31.8–35.4)
MCV: 85.6 fL (ref 80–97)
MID (cbc): 0.7 (ref 0–0.9)
MPV: 8.4 fL (ref 0–99.8)
POC GRANULOCYTE: 5.4 (ref 2–6.9)
POC LYMPH PERCENT: 34.3 %L (ref 10–50)
POC MID %: 7.9 %M (ref 0–12)
Platelet Count, POC: 194 10*3/uL (ref 142–424)
RBC: 5.31 M/uL (ref 4.69–6.13)
RDW, POC: 13.7 %
WBC: 9.3 10*3/uL (ref 4.6–10.2)

## 2015-02-16 LAB — POC MICROSCOPIC URINALYSIS (UMFC)

## 2015-02-16 NOTE — Patient Instructions (Signed)

## 2015-02-16 NOTE — Progress Notes (Signed)
Subjective:  Patient ID: Antonio Kelley, male    DOB: Oct 28, 1990  Age: 24 y.o. MRN: [id number]  CC: Abdominal Pain   HPI Antonio Kelley presents  with abdominal pain he describes his left lower quadrant. He is concerned he might have reflux. Despite not having heartburn or indigestion. He denies any symptoms of reflux. Denies any nausea vomiting or food intolerance. He denies any appetite changes. He denies any stool changes. He said that he did have some blood on his toilet paper today. Has no fever or chills denies any dysuria urgency or frequency. Said the pain is annoying but not significant.  History Antonio Kelley has a past medical history of Hypertension; Allergy; and Asthma.   He has past surgical history that includes Finger surgery.   His  family history includes Cancer in his maternal grandmother and mother.  He   reports that he has quit smoking. He has quit using smokeless tobacco. He reports that he does not drink alcohol or use illicit drugs.  Outpatient Prescriptions Prior to Visit  Medication Sig Dispense Refill  . albuterol (PROVENTIL HFA;VENTOLIN HFA) 108 (90 BASE) MCG/ACT inhaler Inhale 2 puffs into the lungs every 4 (four) hours as needed for wheezing or shortness of breath (cough, shortness of breath or wheezing.). 1 Inhaler 1  . albuterol (PROVENTIL) (2.5 MG/3ML) 0.083% nebulizer solution Take 3 mLs (2.5 mg total) by nebulization every 6 (six) hours as needed for wheezing or shortness of breath. 150 mL 1  . Albuterol Sulfate (PROAIR RESPICLICK) 108 (90 BASE) MCG/ACT AEPB Inhale 2 puffs into the lungs every 4 (four) hours as needed. 1 each 12  . Mometasone Furoate (ASMANEX HFA) 100 MCG/ACT AERO Inhale 2 puffs into the lungs 2 (two) times daily. Start this medication and take consistently (10 days) for chest tightness and cough. 13 g 3  . predniSONE (DELTASONE) 20 MG tablet Take 2 tablets (40 mg total) by mouth daily with breakfast. 10 tablet 0  . desloratadine (CLARINEX  REDITAB) 5 MG disintegrating tablet Take 1 tablet (5 mg total) by mouth daily. (Patient not taking: Reported on 01/20/2015) 30 tablet 3  . Nebulizers (COMPRESSOR NEBULIZER) MISC 1 ampule by Does not apply route 4 (four) times daily. (Patient not taking: Reported on 02/16/2015) 1 each 0   No facility-administered medications prior to visit.    Social History   Social History  . Marital Status: Single    Spouse Name: N/A  . Number of Children: N/A  . Years of Education: N/A   Social History Main Topics  . Smoking status: Former Games developer  . Smokeless tobacco: Former Neurosurgeon  . Alcohol Use: No  . Drug Use: No  . Sexual Activity: Not Asked   Other Topics Concern  . None   Social History Narrative     Review of Systems  Constitutional: Negative for fever, chills and appetite change.  HENT: Negative for congestion, ear pain, postnasal drip, sinus pressure and sore throat.   Eyes: Negative for pain and redness.  Respiratory: Negative for cough, shortness of breath and wheezing.   Cardiovascular: Negative for leg swelling.  Gastrointestinal: Positive for abdominal pain and blood in stool. Negative for nausea, vomiting, diarrhea and constipation.  Endocrine: Negative for polyuria.  Genitourinary: Negative for dysuria, urgency, frequency and flank pain.  Musculoskeletal: Negative for gait problem.  Skin: Negative for rash.  Neurological: Negative for weakness and headaches.  Psychiatric/Behavioral: Negative for confusion and decreased concentration. The patient is not nervous/anxious.     Objective:  BP 122/76 mmHg  Pulse 78  Temp(Src) 98.1 F (36.7 C) (Oral)  Resp 16  Ht 6' (1.829 m)  Wt 255 lb (115.667 kg)  BMI 34.58 kg/m2  SpO2 97%  Physical Exam  Constitutional: He is oriented to person, place, and time. He appears well-developed and well-nourished.  HENT:  Head: Normocephalic and atraumatic.  Eyes: Conjunctivae are normal. Pupils are equal, round, and reactive to light.    Pulmonary/Chest: Effort normal.  Abdominal: Soft. Normal appearance and bowel sounds are normal. There is no tenderness.  Musculoskeletal: He exhibits no edema.  Neurological: He is alert and oriented to person, place, and time.  Skin: Skin is dry.  Psychiatric: He has a normal mood and affect. His behavior is normal. Thought content normal.      Assessment & Plan:   Antonio Kelley was seen today for abdominal pain.  Diagnoses and all orders for this visit:  Abdominal pain, left lower quadrant -     POCT CBC -     POCT urinalysis dipstick -     POCT Microscopic Urinalysis ([facility])   I have discontinued Antonio Kelley's desloratadine and Compressor Nebulizer. I am also having him maintain his predniSONE, Mometasone Furoate, albuterol, albuterol, and Albuterol Sulfate.  No orders of the defined types were placed in this encounter.    Appropriate red flag conditions were discussed with the patient as well as actions that should be taken.  Patient expressed his understanding.  Follow-up: Return if symptoms worsen or fail to improve.  Carmelina DaneAnderson, Brigit Doke S, MD   Results for orders placed or performed in visit on 02/16/15  POCT CBC  Result Value Ref Range   WBC 9.3 4.6 - 10.2 K/uL   Lymph, poc 3.2 0.6 - 3.4   POC LYMPH PERCENT 34.3 10 - 50 %L   MID (cbc) 0.7 0 - 0.9   POC MID % 7.9 0 - 12 %M   POC Granulocyte 5.4 2 - 6.9   Granulocyte percent 57.8 37 - 80 %G   RBC 5.31 4.69 - 6.13 M/uL   Hemoglobin 15.8 14.1 - 18.1 g/dL   HCT, POC 40.945.5 81.143.5 - 53.7 %   MCV 85.6 80 - 97 fL   MCH, POC 29.8 27 - 31.2 pg   MCHC 34.8 31.8 - 35.4 g/dL   RDW, POC 91.413.7 %   Platelet Count, POC 194 142 - 424 K/uL   MPV 8.4 0 - 99.8 fL  POCT urinalysis dipstick  Result Value Ref Range   Color, UA yellow yellow   Clarity, UA clear clear   Glucose, UA negative negative   Bilirubin, UA negative negative   Ketones, POC UA negative negative   Spec Grav, UA 1.025    Blood, UA negative negative   pH, UA 6.0     Protein Ur, POC negative negative   Urobilinogen, UA 0.2    Nitrite, UA Negative Negative   Leukocytes, UA Negative Negative  POCT Microscopic Urinalysis ([facility])  Result Value Ref Range   WBC,UR,HPF,POC None None WBC/hpf   RBC,UR,HPF,POC None None RBC/hpf   Bacteria None None, Too numerous to count   Mucus Present (A) Absent   Epithelial Cells, UR Per Microscopy None None, Too numerous to count cells/hpf

## 2016-04-30 ENCOUNTER — Ambulatory Visit (INDEPENDENT_AMBULATORY_CARE_PROVIDER_SITE_OTHER): Payer: BLUE CROSS/BLUE SHIELD | Admitting: Urgent Care

## 2016-04-30 VITALS — BP 120/80 | Temp 98.2°F | Resp 16 | Ht 72.0 in | Wt 243.0 lb

## 2016-04-30 DIAGNOSIS — S39012A Strain of muscle, fascia and tendon of lower back, initial encounter: Secondary | ICD-10-CM

## 2016-04-30 DIAGNOSIS — M62830 Muscle spasm of back: Secondary | ICD-10-CM

## 2016-04-30 DIAGNOSIS — M545 Low back pain, unspecified: Secondary | ICD-10-CM

## 2016-04-30 MED ORDER — NAPROXEN SODIUM 550 MG PO TABS: 550.0000 mg | ORAL_TABLET | Freq: Two times a day (BID) | ORAL | 1 refills | Status: DC

## 2016-04-30 MED ORDER — CYCLOBENZAPRINE HCL 5 MG PO TABS: 5.0000 mg | ORAL_TABLET | Freq: Three times a day (TID) | ORAL | 1 refills | Status: DC | PRN

## 2016-04-30 NOTE — Patient Instructions (Addendum)
Back Pain, Adult Back pain is very common in adults.The cause of back pain is rarely dangerous and the pain often gets better over time.The cause of your back pain may not be known. Some common causes of back pain include:  Strain of the muscles or ligaments supporting the spine.  Wear and tear (degeneration) of the spinal disks.  Arthritis.  Direct injury to the back. For many people, back pain may return. Since back pain is rarely dangerous, most people can learn to manage this condition on their own. Follow these instructions at home: Watch your back pain for any changes. The following actions may help to lessen any discomfort you are feeling:  Remain active. It is stressful on your back to sit or stand in one place for long periods of time. Do not sit, drive, or stand in one place for more than 30 minutes at a time. Take short walks on even surfaces as soon as you are able.Try to increase the length of time you walk each day.  Exercise regularly as directed by your health care provider. Exercise helps your back heal faster. It also helps avoid future injury by keeping your muscles strong and flexible.  Do not stay in bed.Resting more than 1-2 days can delay your recovery.  Pay attention to your body when you bend and lift. The most comfortable positions are those that put less stress on your recovering back. Always use proper lifting techniques, including:  Bending your knees.  Keeping the load close to your body.  Avoiding twisting.  Find a comfortable position to sleep. Use a firm mattress and lie on your side with your knees slightly bent. If you lie on your back, put a pillow under your knees.  Avoid feeling anxious or stressed.Stress increases muscle tension and can worsen back pain.It is important to recognize when you are anxious or stressed and learn ways to manage it, such as with exercise.  Take medicines only as directed by your health care provider.  Over-the-counter medicines to reduce pain and inflammation are often the most helpful.Your health care provider may prescribe muscle relaxant drugs.These medicines help dull your pain so you can more quickly return to your normal activities and healthy exercise.  Apply ice to the injured area:  Put ice in a plastic bag.  Place a towel between your skin and the bag.  Leave the ice on for 20 minutes, 2-3 times a day for the first 2-3 days. After that, ice and heat may be alternated to reduce pain and spasms.  Maintain a healthy weight. Excess weight puts extra stress on your back and makes it difficult to maintain good posture. Contact a health care provider if:  You have pain that is not relieved with rest or medicine.  You have increasing pain going down into the legs or buttocks.  You have pain that does not improve in one week.  You have night pain.  You lose weight.  You have a fever or chills. Get help right away if:  You develop new bowel or bladder control problems.  You have unusual weakness or numbness in your arms or legs.  You develop nausea or vomiting.  You develop abdominal pain.  You feel faint. This information is not intended to replace advice given to you by your health care provider. Make sure you discuss any questions you have with your health care provider. Document Released: 03/04/2005 Document Revised: 07/13/2015 Document Reviewed: 07/06/2013 Elsevier Interactive Patient Education  2017 Elsevier   Inc.    Lumbosacral Strain Lumbosacral strain is an injury that causes pain in the lower back (lumbosacral spine). This injury usually occurs from overstretching the muscles or ligaments along your spine. A strain can affect one or more muscles or cord-like tissues that connect bones to other bones (ligaments). What are the causes? This condition may be caused by:  A hard, direct hit (blow) to the back.  Excessive stretching of the lower back muscles.  This may result from:  A fall.  Lifting something heavy.  Repetitive movements such as bending or crouching. What increases the risk? The following factors may increase your risk of getting this condition:  Participating in sports or activities that involve:  A sudden twist of the back.  Pushing or pulling motions.  Being overweight or obese.  Having poor strength and flexibility, especially tight hamstrings or weak muscles in the back or abdomen.  Having too much of a curve in the lower back.  Having a pelvis that is tilted forward. What are the signs or symptoms? The main symptom of this condition is pain in the lower back, at the site of the strain. Pain may extend (radiate) down one or both legs. How is this diagnosed? This condition is diagnosed based on:  Your symptoms.  Your medical history.  A physical exam.  Your health care provider may push on certain areas of your back to determine the source of your pain.  You may be asked to bend forward, backward, and side to side to assess the severity of your pain and your range of motion.  Imaging tests, such as:  X-rays.  MRI. How is this treated? Treatment for this condition may include:  Putting heat and cold on the affected area.  Medicines to help relieve pain and relax your muscles (muscle relaxants).  NSAIDs to help reduce swelling and discomfort. When your symptoms improve, it is important to gradually return to your normal routine as soon as possible to reduce pain, avoid stiffness, and avoid loss of muscle strength. Generally, symptoms should improve within 6 weeks of treatment. However, recovery time varies. Follow these instructions at home: Managing pain, stiffness, and swelling  If directed, put ice on the injured area during the first 24 hours after your strain.  Put ice in a plastic bag.  Place a towel between your skin and the bag.  Leave the ice on for 20 minutes, 2-3 times a day.  If  directed, put heat on the affected area as often as told by your health care provider. Use the heat source that your health care provider recommends, such as a moist heat pack or a heating pad.  Place a towel between your skin and the heat source.  Leave the heat on for 20-30 minutes.  Remove the heat if your skin turns bright red. This is especially important if you are unable to feel pain, heat, or cold. You may have a greater risk of getting burned. Activity  Rest and return to your normal activities as told by your health care provider. Ask your health care provider what activities are safe for you.  Avoid activities that take a lot of energy for as long as told by your health care provider. General instructions  Take over-the-counter and prescription medicines only as told by your health care provider.  Donot drive or use heavy machinery while taking prescription pain medicine.  Do not use any products that contain nicotine or tobacco, such as cigarettes and e-cigarettes.  If you need help quitting, ask your health care provider.  Keep all follow-up visits as told by your health care provider. This is important. How is this prevented?  Use correct form when playing sports and lifting heavy objects.  Use good posture when sitting and standing.  Maintain a healthy weight.  Sleep on a mattress with medium firmness to support your back.  Be safe and responsible while being active to avoid falls.  Do at least 150 minutes of moderate-intensity exercise each week, such as brisk walking or water aerobics. Try a form of exercise that takes stress off your back, such as swimming or stationary cycling.  Maintain physical fitness, including:  Strength.  Flexibility.  Cardiovascular fitness.  Endurance. Contact a health care provider if:  Your back pain does not improve after 6 weeks of treatment.  Your symptoms get worse. Get help right away if:  Your back pain is  severe.  You cannot stand or walk.  You have difficulty controlling when you urinate or when you have a bowel movement.  You feel nauseous or you vomit.  Your feet get very cold.  You have numbness, tingling, weakness, or problems using your arms or legs.  You develop any of the following:  Shortness of breath.  Dizziness.  Pain in your legs.  Weakness in your buttocks or legs.  Discoloration of the skin on your toes or legs. This information is not intended to replace advice given to you by your health care provider. Make sure you discuss any questions you have with your health care provider. Document Released: 12/12/2004 Document Revised: 09/22/2015 Document Reviewed: 08/06/2015 Elsevier Interactive Patient Education  2017 [employer].   IF you received an x-ray today, you will receive an invoice from [facility]. Please contact [facility] at [phone] with questions or concerns regarding your invoice.   IF you received labwork today, you will receive an invoice from [facility]. Please contact [facility] at [phone] with questions or concerns regarding your invoice.   Our billing staff will not be able to assist you with questions regarding bills from these companies.  You will be contacted with the lab results as soon as they are available. The fastest way to get your results is to activate your My Chart account. Instructions are located on the last page of this paperwork. If you have not heard from [place] regarding the results in 2 weeks, please contact this office.

## 2016-04-30 NOTE — Progress Notes (Signed)
  MRN: [id number] DOB: 01-Oct-1990  Subjective:   Antonio Kelley is a 26 y.o. male presenting for chief complaint of Back Pain  Reports 4 day history of low back pain that started after he moved furniture around in his home this past weekend. He felt a stinging sensation then, has since worsened significantly in the past day. Reports constant, sharp, low back pain that radiates into his right thigh. Has had difficulty walking, ambulating in general. Pain is tolerable with sitting and wearing a back brace. Denies weakness, numbness or tingling, incontinence, swelling. Denies history of back surgeries. 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  Antonio Kelley currently has no medications in their medication list. Also has No Known Allergies.  Antonio Kelley  has a past medical history of Allergy; Asthma; and Hypertension. Also  has a past surgical history that includes Finger surgery.  Objective:   Vitals: BP 120/80   Temp 98.2 F (36.8 C) (Oral)   Resp 16   Ht 6' (1.829 m)   Wt 243 lb (110.2 kg)   SpO2 97%   BMI 32.96 kg/m    Physical Exam  Constitutional: He is oriented to person, place, and time. He appears well-developed and well-nourished.  Cardiovascular: Normal rate.   Pulmonary/Chest: Effort normal.  Musculoskeletal:       Lumbar back: He exhibits decreased range of motion (flexion and extension), tenderness (with flexion and extension over lumbar region) and spasm (over lumbar paraspinal muscles). He exhibits no bony tenderness, no swelling, no edema, no deformity and no laceration.  Negative straight leg raise.  Neurological: He is alert and oriented to person, place, and time. Coordination (rising very gingerly from sitting position) abnormal.   Assessment and Plan :   1. Strain of lumbar region, initial encounter 2. Acute bilateral low back pain without sciatica 3. Lumbar paraspinal muscle spasm - Start conservative management. Use Anaprox, muscle relaxant, work  restrictions. Follow up in 1 week if no improvement. Consider imaging, PT, referral to ortho.   Antonio BambergMario Hillard Goodwine, PA-C [facility] [phone] 04/30/2016  5:40 PM

## 2016-05-09 ENCOUNTER — Ambulatory Visit (INDEPENDENT_AMBULATORY_CARE_PROVIDER_SITE_OTHER): Payer: BLUE CROSS/BLUE SHIELD | Admitting: Urgent Care

## 2016-05-09 ENCOUNTER — Encounter: Payer: Self-pay | Admitting: Urgent Care

## 2016-05-09 VITALS — BP 120/66 | HR 85 | Temp 98.7°F | Resp 16 | Ht 71.0 in | Wt 233.4 lb

## 2016-05-09 DIAGNOSIS — M545 Low back pain, unspecified: Secondary | ICD-10-CM

## 2016-05-09 DIAGNOSIS — S39012D Strain of muscle, fascia and tendon of lower back, subsequent encounter: Secondary | ICD-10-CM | POA: Diagnosis not present

## 2016-05-09 NOTE — Progress Notes (Signed)
    MRN: [id number] DOB: 21-Apr-1990  Subjective:   Antonio Kelley is a 26 y.o. male presenting for follow up on lumbar strain. Last OV was 04/30/2016, was placed on work restrictions and recommended conservative management. Today, he reports significant improvement. He has been taking care of his back by limiting activities, lifting lightly, following work restrictions. Anaprox and Flexeril have also helped. He did tolerate performing his full work activities yesterday with just mild soreness. Denies pain, numbness or tingling, weakness, incontinence.   Antonio Kelley has a current medication list which includes the following prescription(s): cyclobenzaprine and naproxen sodium. Also has No Known Allergies.  Antonio Kelley  has a past medical history of Allergy; Asthma; and Hypertension. Also  has a past surgical history that includes Finger surgery.  Objective:   Vitals: BP 120/66   Pulse 85   Temp 98.7 F (37.1 C) (Oral)   Resp 16   Ht 5\' 11"  (1.803 m)   Wt 233 lb 6.4 oz (105.9 kg)   SpO2 97%   BMI 32.55 kg/m   Physical Exam  Constitutional: He is oriented to person, place, and time. He appears well-developed and well-nourished.  Cardiovascular: Normal rate.   Pulmonary/Chest: Effort normal.  Musculoskeletal:       Lumbar back: He exhibits normal range of motion, no tenderness, no bony tenderness, no swelling, no edema, no deformity and no spasm.  Neurological: He is alert and oriented to person, place, and time. He displays normal reflexes. Coordination normal.   Assessment and Plan :   1. Strain of lumbar region, subsequent encounter 2. Acute bilateral low back pain without sciatica - Improved, back stretches reviewed. Continue medical therapy as needed. Okay to refill Flexeril up to 1 year if he requests this in the future.   Wallis BambergMario Matei Magnone, PA-C [facility] [phone] 05/09/2016 8:47 AM

## 2016-05-09 NOTE — Patient Instructions (Addendum)
Back Pain, Adult Back pain is very common in adults.The cause of back pain is rarely dangerous and the pain often gets better over time.The cause of your back pain may not be known. Some common causes of back pain include:  Strain of the muscles or ligaments supporting the spine.  Wear and tear (degeneration) of the spinal disks.  Arthritis.  Direct injury to the back. For many people, back pain may return. Since back pain is rarely dangerous, most people can learn to manage this condition on their own. Follow these instructions at home: Watch your back pain for any changes. The following actions may help to lessen any discomfort you are feeling:  Remain active. It is stressful on your back to sit or stand in one place for long periods of time. Do not sit, drive, or stand in one place for more than 30 minutes at a time. Take short walks on even surfaces as soon as you are able.Try to increase the length of time you walk each day.  Exercise regularly as directed by your health care provider. Exercise helps your back heal faster. It also helps avoid future injury by keeping your muscles strong and flexible.  Do not stay in bed.Resting more than 1-2 days can delay your recovery.  Pay attention to your body when you bend and lift. The most comfortable positions are those that put less stress on your recovering back. Always use proper lifting techniques, including:  Bending your knees.  Keeping the load close to your body.  Avoiding twisting.  Find a comfortable position to sleep. Use a firm mattress and lie on your side with your knees slightly bent. If you lie on your back, put a pillow under your knees.  Avoid feeling anxious or stressed.Stress increases muscle tension and can worsen back pain.It is important to recognize when you are anxious or stressed and learn ways to manage it, such as with exercise.  Take medicines only as directed by your health care provider.  Over-the-counter medicines to reduce pain and inflammation are often the most helpful.Your health care provider may prescribe muscle relaxant drugs.These medicines help dull your pain so you can more quickly return to your normal activities and healthy exercise.  Apply ice to the injured area:  Put ice in a plastic bag.  Place a towel between your skin and the bag.  Leave the ice on for 20 minutes, 2-3 times a day for the first 2-3 days. After that, ice and heat may be alternated to reduce pain and spasms.  Maintain a healthy weight. Excess weight puts extra stress on your back and makes it difficult to maintain good posture. Contact a health care provider if:  You have pain that is not relieved with rest or medicine.  You have increasing pain going down into the legs or buttocks.  You have pain that does not improve in one week.  You have night pain.  You lose weight.  You have a fever or chills. Get help right away if:  You develop new bowel or bladder control problems.  You have unusual weakness or numbness in your arms or legs.  You develop nausea or vomiting.  You develop abdominal pain.  You feel faint. This information is not intended to replace advice given to you by your health care provider. Make sure you discuss any questions you have with your health care provider. Document Released: 03/04/2005 Document Revised: 07/13/2015 Document Reviewed: 07/06/2013 Elsevier Interactive Patient Education  2017 Elsevier   Inc.     IF you received an x-ray today, you will receive an invoice from [facility]. Please contact [facility] at [phone] with questions or concerns regarding your invoice.   IF you received labwork today, you will receive an invoice from [facility]. Please contact [facility] at [phone] with questions or concerns regarding your invoice.   Our billing staff will not be able to assist you with questions regarding bills from these  companies.  You will be contacted with the lab results as soon as they are available. The fastest way to get your results is to activate your My Chart account. Instructions are located on the last page of this paperwork. If you have not heard from us regarding the results in 2 weeks, please contact this office.      

## 2016-05-22 ENCOUNTER — Ambulatory Visit (INDEPENDENT_AMBULATORY_CARE_PROVIDER_SITE_OTHER): Payer: BLUE CROSS/BLUE SHIELD | Admitting: Physician Assistant

## 2016-05-22 ENCOUNTER — Ambulatory Visit: Payer: BLUE CROSS/BLUE SHIELD

## 2016-05-22 VITALS — BP 144/77 | HR 80 | Temp 98.4°F | Resp 17 | Ht 72.0 in | Wt 236.0 lb

## 2016-05-22 DIAGNOSIS — R222 Localized swelling, mass and lump, trunk: Secondary | ICD-10-CM

## 2016-05-22 NOTE — Patient Instructions (Addendum)
  Please await contact for the ultrasound of this nodule.  I will contact you with those results.    IF you received an x-ray today, you will receive an invoice from [facility]. Please contact [facility] at [phone] with questions or concerns regarding your invoice.   IF you received labwork today, you will receive an invoice from [facility]. Please contact [facility] at [phone] with questions or concerns regarding your invoice.   Our billing staff will not be able to assist you with questions regarding bills from these companies.  You will be contacted with the lab results as soon as they are available. The fastest way to get your results is to activate your My Chart account. Instructions are located on the last page of this paperwork. If you have not heard from us regarding the results in 2 weeks, please contact this office.

## 2016-05-22 NOTE — Progress Notes (Signed)
[facility] [address], [place] [address] [phone]  Date:  05/22/2016   Name:  Antonio SailorsCody Kelley   DOB:  1990/07/27   MRN:  [id number]  PCP:  No PCP Per Patient    History of Present Illness:  Antonio SailorsCody Kelley is a 26 y.o. male patient who [facility] in [facility] area.  Patient noticed nodule that is at the center of his sternum that has no pain.  He was rubbing and noticed the nodule. Family hx of breast cancer mother and MGM Stomach cancer maternal aunt  There are no active problems to display for this patient.   Past Medical History:  Diagnosis Date  . Allergy   . Asthma   . Hypertension     Past Surgical History:  Procedure Laterality Date  . FINGER SURGERY      Social History  Substance Use Topics  . Smoking status: Former Games developermoker  . Smokeless tobacco: Former NeurosurgeonUser  . Alcohol use No    Family History  Problem Relation Age of Onset  . Cancer Mother   . Cancer Maternal Grandmother     No Known Allergies  Medication list has been reviewed and updated.  Current Outpatient Prescriptions on File Prior to Visit  Medication Sig Dispense Refill  . cyclobenzaprine (FLEXERIL) 5 MG tablet Take 1 tablet (5 mg total) by mouth 3 (three) times daily as needed. 90 tablet 1  . naproxen sodium (ANAPROX DS) 550 MG tablet Take 1 tablet (550 mg total) by mouth 2 (two) times daily with a meal. 30 tablet 1   No current facility-administered medications on file prior to visit.     ROS ROS otherwise unremarkable unless listed above.   Physical Examination: BP (!) 144/77 (BP Location: Right Arm, Patient Position: Sitting, Cuff Size: Normal)   Pulse 80   Temp 98.4 F (36.9 C) (Oral)   Resp 17   Ht 6' (1.829 m)   Wt 236 lb (107 kg)   SpO2 97%   BMI 32.01 kg/m  Ideal Body Weight: Weight in (lb) to have BMI = 25: 183.9  Physical Exam  Constitutional: He is oriented to person, place, and time. He appears well-developed and well-nourished.  No distress.  HENT:  Head: Normocephalic and atraumatic.  Eyes: Conjunctivae and EOM are normal. Pupils are equal, round, and reactive to light.  Cardiovascular: Normal rate.   Pulmonary/Chest: Effort normal. No respiratory distress.  Neurological: He is alert and oriented to person, place, and time.  Skin: Skin is warm and dry. He is not diaphoretic.  nontender mobile 1cm well circumscribed nodule at the left side of chest lateral to the xyphoid.  Psychiatric: He has a normal mood and affect. His behavior is normal.     Assessment and Plan: Antonio SailorsCody Kelley is a 26 y.o. male who is here today for cc of bump on chest Likely benign cyst. Reassured patient.  He would like ultrasound.  Ordered, and follow up with results.  Nodule of chest wall - Plan: US Chest  Trena PlattStephanie Tamarra Geiselman, PA-C [facility] 3/8/20188:23 AM

## 2016-05-28 ENCOUNTER — Ambulatory Visit
Admission: RE | Admit: 2016-05-28 | Discharge: 2016-05-28 | Disposition: A | Discharge status: Home / Self Care | Payer: BLUE CROSS/BLUE SHIELD | Source: Ambulatory Visit | Attending: Physician Assistant | Admitting: Physician Assistant

## 2016-08-06 ENCOUNTER — Telehealth: Payer: Self-pay | Admitting: Physician Assistant

## 2016-08-06 NOTE — Telephone Encounter (Signed)
05/2016 last ov I dont see current rx for inhaler

## 2016-08-06 NOTE — Telephone Encounter (Signed)
I definitely did not see him for this.  It appears like he has not been seen for this by us since 2016.  He should return for refill.  Please alert him to return for the clinic.  His last medication is in 2016

## 2016-08-06 NOTE — Telephone Encounter (Signed)
Patient needs to get his albuterol (PROVENTIL HFA;VENTOLIN refilled he uses the [facility] in [place].  His call back number is [phone]

## 2016-08-07 NOTE — Telephone Encounter (Signed)
Pt advised needs appt tx up front

## 2016-08-08 ENCOUNTER — Ambulatory Visit (INDEPENDENT_AMBULATORY_CARE_PROVIDER_SITE_OTHER): Payer: BLUE CROSS/BLUE SHIELD | Admitting: Family Medicine

## 2016-08-08 ENCOUNTER — Encounter: Payer: Self-pay | Admitting: Family Medicine

## 2016-08-08 VITALS — BP 113/79 | HR 83 | Temp 98.1°F | Resp 16 | Ht 71.0 in | Wt 234.4 lb

## 2016-08-08 DIAGNOSIS — J309 Allergic rhinitis, unspecified: Secondary | ICD-10-CM | POA: Diagnosis not present

## 2016-08-08 DIAGNOSIS — J4521 Mild intermittent asthma with (acute) exacerbation: Secondary | ICD-10-CM | POA: Diagnosis not present

## 2016-08-08 MED ORDER — BUDESONIDE 90 MCG/ACT IN AEPB: 1.0000 | INHALATION_SPRAY | Freq: Two times a day (BID) | RESPIRATORY_TRACT | 5 refills | Status: DC

## 2016-08-08 MED ORDER — FLUTICASONE PROPIONATE 50 MCG/ACT NA SUSP: 1.0000 | Freq: Every day | NASAL | 6 refills | Status: DC

## 2016-08-08 MED ORDER — ALBUTEROL SULFATE 108 (90 BASE) MCG/ACT IN AEPB: 1.0000 | INHALATION_SPRAY | RESPIRATORY_TRACT | 0 refills | Status: DC | PRN

## 2016-08-08 NOTE — Progress Notes (Signed)
Subjective:  By signing my name below, I, Antonio Kelley, attest that this documentation has been prepared under the direction and in the presence of [name], MD. Electronically Signed: Stann Kelley, Scribe. 08/08/2016 , 3:47 PM .  Patient was seen in Room 26 .   Patient ID: Antonio Kelley, male    DOB: Jul 22, 1990, 26 y.o.   MRN: [id number] Chief Complaint  Patient presents with  . Medication Refill    patient requesting Albuterol   HPI Antonio Kelley is a 26 y.o. male Here for follow up on reactive airway. He is requesting albuterol inhaler refill. It appears he was prescribed albuterol in Dec 2016 through [facility]. I have seen patient for reactive airway disease back in Sept 2016, and treated with nebulizer treatment in office. He was started on asmanex inhaler at that point, and he's refilled it once after initial prescription.   Patient states he's had childhood asthma and it returned recently. 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 He's been using his albuterol inhaler about 1-2 times each day. He denies waking up at night with shortness of breath. He has dogs at home, but denies being allergic to dogs in the past; although, he does have some watery eyes around dogs. He denies any wheezing today.   He hasn't been taking medication for heartburn. He's been having seasonal allergy symptoms including rhinorrhea and watery eyes. He's been taking Zyrtec for his allergies. He hasn't tried any nasal sprays because he's afraid of becoming addicted to them (afrin).   There are no active problems to display for this patient.  Past Medical History:  Diagnosis Date  . Allergy   . Asthma   . Hypertension    Past Surgical History:  Procedure Laterality Date  . FINGER SURGERY     No Known Allergies Prior to Admission medications   Medication Sig Start Date End Date Taking? Authorizing Provider   cyclobenzaprine (FLEXERIL) 5 MG tablet Take 1 tablet (5 mg total) by mouth 3 (three) times daily as needed. 04/30/16   Wallis Bamberg, PA-C  naproxen sodium (ANAPROX DS) 550 MG tablet Take 1 tablet (550 mg total) by mouth 2 (two) times daily with a meal. 04/30/16   Wallis Bamberg, PA-C   Social History   Social History  . Marital status: Single    Spouse name: N/A  . Number of children: N/A  . Years of education: N/A   Occupational History  . Not on file.   Social History Main Topics  . Smoking status: Former Games developer  . Smokeless tobacco: Former Neurosurgeon  . Alcohol use No  . Drug use: No  . Sexual activity: No   Other Topics Concern  . Not on file   Social History Narrative  . No narrative on file   Review of Systems  Constitutional: Negative for fatigue and unexpected weight change.  HENT: Positive for rhinorrhea.   Eyes: Negative for visual disturbance.  Respiratory: Negative for cough, chest tightness and shortness of breath.   Cardiovascular: Negative for chest pain, palpitations and leg swelling.  Gastrointestinal: Negative for abdominal pain and blood in stool.  Allergic/Immunologic: Positive for environmental allergies.  Neurological: Negative for dizziness, light-headedness and headaches.       Objective:   Physical Exam  Constitutional: He is oriented to person, place, and time. He appears well-developed and well-nourished. No distress.  HENT:  Head:  Normocephalic and atraumatic.  Nose: Mucosal edema (R>L) present.  Eyes: EOM are normal. Pupils are equal, round, and reactive to light.  Neck: Neck supple.  Cardiovascular: Normal rate.   Pulmonary/Chest: Effort normal. No respiratory distress. He has no wheezes.  Musculoskeletal: Normal range of motion.  Neurological: He is alert and oriented to person, place, and time.  Skin: Skin is warm and dry.  Psychiatric: He has a normal mood and affect. His behavior is normal.  Nursing note and vitals reviewed.   Vitals:    08/08/16 1522  BP: 113/79  Pulse: 83  Resp: 16  Temp: 98.1 F (36.7 C)  TempSrc: Oral  SpO2: 96%  Weight: 234 lb 6.4 oz (106.3 kg)  Height: 5\' 11"  (1.803 m)      Assessment & Plan:    [name] is a 26 y.o. male Mild intermittent asthma with acute exacerbation - Plan: Budesonide (PULMICORT FLEXHALER) 90 MCG/ACT inhaler, Albuterol Sulfate (PROAIR RESPICLICK) 108 (90 Base) MCG/ACT AEPB  - Increase in symptoms likely due to new living situation with the dog and carpet.   -Start Pulmicort, can change to other inhaled corticosteroid if more cost effective, but coupon was given.  - Albuterol if needed for breakthrough symptoms, RTC precautions if worsening symptoms.   -As symptoms improve can decrease dose of Pulmicort, then potential trial off inhaled corticosteroid in the next month or two if doing well as in past.  RTC precautions discussed. Recheck 6 months  Allergic rhinitis, unspecified seasonality, unspecified trigger - Plan: fluticasone (FLONASE) 50 MCG/ACT nasal spray  -Continue Zyrtec, add Flonase nasal spray. Correct technique discussed  Meds ordered this encounter  Medications  . Cetirizine HCl (ZYRTEC PO)    Sig: Take by mouth as needed.  . Budesonide (PULMICORT FLEXHALER) 90 MCG/ACT inhaler    Sig: Inhale 1-2 puffs into the lungs 2 (two) times daily.    Dispense:  1 Inhaler    Refill:  5  . Albuterol Sulfate (PROAIR RESPICLICK) 108 (90 Base) MCG/ACT AEPB    Sig: Inhale 1 Inhaler into the lungs every 4 (four) hours as needed.    Dispense:  1 each    Refill:  0    May change to albuterol inhaler 1-2 puffs inhaled every 4-6 hours as needed if less costly than Respiclick  . fluticasone (FLONASE) 50 MCG/ACT nasal spray    Sig: Place 1-2 sprays into both nostrils daily.    Dispense:  16 g    Refill:  6   Patient Instructions   Asthma likely flared from allergies, including from possible dander or carpet at home. Try air purifier in the bedroom where you sleep. Start  Pulmicort Flexhaler 2 puffs twice per day. As symptoms improve over the next few weeks, can decrease to 1 puff twice per day. Then try off medication in the next month or 2 to see if you still require the daily steroid inhaler. Continue albuterol if needed for breakthrough symptoms. If you do require albuterol more than twice per week, or any nighttime symptoms, return to Pulmicort Flexhaler daily  Let me know if inhaler is cost prohibitive, as there are other options.  For allergies, continue Zyrtec, start Flonase nasal spray 1-2 sprays per nostril once per day.   Asthma Attack Prevention, Adult Although you may not be able to control the fact that you have asthma, you can take actions to prevent episodes of asthma (asthma attacks). These actions include:  Creating a written plan for managing and treating your  asthma attacks (asthma action plan).  Monitoring your asthma.  Avoiding things that can irritate your airways or make your asthma symptoms worse (asthma triggers).  Taking your medicines as directed.  Acting quickly if you have signs or symptoms of an asthma attack. What are some ways to prevent an asthma attack? Create a plan  Work with your health care provider to create an asthma action plan. This plan should include:  A list of your asthma triggers and how to avoid them.  A list of symptoms that you experience during an asthma attack.  Information about when to take medicine and how much medicine to take.  Information to help you understand your peak flow measurements.  Contact information for your health care providers.  Daily actions that you can take to control asthma. Monitor your asthma   To monitor your asthma:  Use your peak flow meter every morning and every evening for 2-3 weeks. Record the results in a journal. A drop in your peak flow numbers on one or more days may mean that you are starting to have an asthma attack, even if you are not having  symptoms.  When you have asthma symptoms, write them down in a journal. Avoid asthma triggers   Work with your health care provider to find out what your asthma triggers are. This can be done by:  Being tested for allergies.  Keeping a journal that notes when asthma attacks occur and what may have contributed to them.  Asking your health care provider whether other medical conditions make your asthma worse. Common asthma triggers include:  Dust.  Smoke. This includes campfire smoke and secondhand smoke from tobacco products.  Pet dander.  Trees, grasses or pollens.  Very cold, dry, or humid air.  Mold.  Foods that contain high amounts of sulfites.  Strong smells.  Engine exhaust and air pollution.  Aerosol sprays and fumes from household cleaners.  Household pests and their droppings, including dust mites and cockroaches.  Certain medicines, including NSAIDs. Once you have determined your asthma triggers, take steps to avoid them. Depending on your triggers, you may be able to reduce the chance of an asthma attack by:  Keeping your home clean. Have someone dust and vacuum your home for you 1 or 2 times a week. If possible, have them use a high-efficiency particulate arrestance (HEPA) vacuum.  Washing your sheets weekly in hot water.  Using allergy-proof mattress covers and casings on your bed.  Keeping pets out of your home.  Taking care of mold and water problems in your home.  Avoiding areas where people smoke.  Avoiding using strong perfumes or odor sprays.  Avoid spending a lot of time outdoors when pollen counts are high and on very windy days.  Talking with your health care provider before stopping or starting any new medicines. Medicines  Take over-the-counter and prescription medicines only as told by your health care provider. Many asthma attacks can be prevented by carefully following your medicine schedule. Taking your medicines correctly is  especially important when you cannot avoid certain asthma triggers. Even if you are doing well, do not stop taking your medicine and do not take less medicine. Act quickly  If an asthma attack happens, acting quickly can decrease how severe it is and how long it lasts. Take these actions:  Pay attention to your symptoms. If you are coughing, wheezing, or having difficulty breathing, do not wait to see if your symptoms go away on their own.  Follow your asthma action plan.  If you have followed your asthma action plan and your symptoms are not improving, call your health care provider or seek immediate medical care at the nearest hospital. It is important to write down how often you need to use your fast-acting rescue inhaler. You can track how often you use an inhaler in your journal. If you are using your rescue inhaler more often, it may mean that your asthma is not under control. Adjusting your asthma treatment plan may help you to prevent future asthma attacks and help you to gain better control of your condition. How can I prevent an asthma attack when I exercise?   Exercise is a common asthma trigger. To prevent asthma attacks during exercise:  Follow advice from your health care provider about whether you should use your fast-acting inhaler before exercising. Many people with asthma experience exercise-induced bronchoconstriction (EIB). This condition often worsens during vigorous exercise in cold, humid, or dry environments. Usually, people with EIB can stay very active by using a fast-acting inhaler before exercising.  Avoid exercising outdoors in very cold or humid weather.  Avoid exercising outdoors when pollen counts are high.  Warm up and cool down when exercising.  Stop exercising right away if asthma symptoms start. Consider taking part in exercises that are less likely to cause asthma symptoms such as:  Indoor swimming.  Biking.  Walking.  Hiking.  Playing  football. This information is not intended to replace advice given to you by your health care provider. Make sure you discuss any questions you have with your health care provider. Document Released: 02/20/2009 Document Revised: 11/03/2015 Document Reviewed: 08/19/2015 Elsevier Interactive Patient Education  2017 Elsevier Inc. Allergic Rhinitis Allergic rhinitis is when the mucous membranes in the nose respond to allergens. Allergens are particles in the air that cause your body to have an allergic reaction. This causes you to release allergic antibodies. Through a chain of events, these eventually cause you to release histamine into the blood stream. Although meant to protect the body, it is this release of histamine that causes your discomfort, such as frequent sneezing, congestion, and an itchy, runny nose. What are the causes? Seasonal allergic rhinitis (hay fever) is caused by pollen allergens that may come from grasses, trees, and weeds. Year-round allergic rhinitis (perennial allergic rhinitis) is caused by allergens such as house dust mites, pet dander, and mold spores. What are the signs or symptoms?  Nasal stuffiness (congestion).  Itchy, runny nose with sneezing and tearing of the eyes. How is this diagnosed? Your health care provider can help you determine the allergen or allergens that trigger your symptoms. If you and your health care provider are unable to determine the allergen, skin or blood testing may be used. Your health care provider will diagnose your condition after taking your health history and performing a physical exam. Your health care provider may assess you for other related conditions, such as asthma, pink eye, or an ear infection. How is this treated? Allergic rhinitis does not have a cure, but it can be controlled by:  Medicines that block allergy symptoms. These may include allergy shots, nasal sprays, and oral antihistamines.  Avoiding the allergen. Hay fever  may often be treated with antihistamines in pill or nasal spray forms. Antihistamines block the effects of histamine. There are over-the-counter medicines that may help with nasal congestion and swelling around the eyes. Check with your health care provider before taking or giving this medicine. If avoiding the  allergen or the medicine prescribed do not work, there are many new medicines your health care provider can prescribe. Stronger medicine may be used if initial measures are ineffective. Desensitizing injections can be used if medicine and avoidance does not work. Desensitization is when a patient is given ongoing shots until the body becomes less sensitive to the allergen. Make sure you follow up with your health care provider if problems continue. Follow these instructions at home: It is not possible to completely avoid allergens, but you can reduce your symptoms by taking steps to limit your exposure to them. It helps to know exactly what you are allergic to so that you can avoid your specific triggers. Contact a health care provider if:  You have a fever.  You develop a cough that does not stop easily (persistent).  You have shortness of breath.  You start wheezing.  Symptoms interfere with normal daily activities. This information is not intended to replace advice given to you by your health care provider. Make sure you discuss any questions you have with your health care provider. Document Released: 11/27/2000 Document Revised: 11/03/2015 Document Reviewed: 11/09/2012 Elsevier Interactive Patient Education  2017 [employer]   IF you received an x-ray today, you will receive an invoice from [facility]. Please contact [facility] at [phone] with questions or concerns regarding your invoice.   IF you received labwork today, you will receive an invoice from [facility]. Please contact [facility] at [phone] with questions or concerns regarding your invoice.    Our billing staff will not be able to assist you with questions regarding bills from these companies.  You will be contacted with the lab results as soon as they are available. The fastest way to get your results is to activate your My Chart account. Instructions are located on the last page of this paperwork. If you have not heard from us regarding the results in 2 weeks, please contact this office.      I personally performed the services described in this documentation, which was scribed in my presence. The recorded information has been reviewed and considered for accuracy and completeness, addended by me as needed, and agree with information above.  Signed,   Meredith StaggersJeffrey Koston Hennes, MD [facility] at [facility].  08/08/16 6:43 PM

## 2016-08-08 NOTE — Patient Instructions (Addendum)
Asthma likely flared from allergies, including from possible dander or carpet at home. Try air purifier in the bedroom where you sleep. Start Pulmicort Flexhaler 2 puffs twice per day. As symptoms improve over the next few weeks, can decrease to 1 puff twice per day. Then try off medication in the next month or 2 to see if you still require the daily steroid inhaler. Continue albuterol if needed for breakthrough symptoms. If you do require albuterol more than twice per week, or any nighttime symptoms, return to Pulmicort Flexhaler daily  Let me know if inhaler is cost prohibitive, as there are other options.  For allergies, continue Zyrtec, start Flonase nasal spray 1-2 sprays per nostril once per day.   Asthma Attack Prevention, Adult Although you may not be able to control the fact that you have asthma, you can take actions to prevent episodes of asthma (asthma attacks). These actions include:  Creating a written plan for managing and treating your asthma attacks (asthma action plan).  Monitoring your asthma.  Avoiding things that can irritate your airways or make your asthma symptoms worse (asthma triggers).  Taking your medicines as directed.  Acting quickly if you have signs or symptoms of an asthma attack. What are some ways to prevent an asthma attack? Create a plan  Work with your health care provider to create an asthma action plan. This plan should include:  A list of your asthma triggers and how to avoid them.  A list of symptoms that you experience during an asthma attack.  Information about when to take medicine and how much medicine to take.  Information to help you understand your peak flow measurements.  Contact information for your health care providers.  Daily actions that you can take to control asthma. Monitor your asthma   To monitor your asthma:  Use your peak flow meter every morning and every evening for 2-3 weeks. Record the results in a journal. A drop  in your peak flow numbers on one or more days may mean that you are starting to have an asthma attack, even if you are not having symptoms.  When you have asthma symptoms, write them down in a journal. Avoid asthma triggers   Work with your health care provider to find out what your asthma triggers are. This can be done by:  Being tested for allergies.  Keeping a journal that notes when asthma attacks occur and what may have contributed to them.  Asking your health care provider whether other medical conditions make your asthma worse. Common asthma triggers include:  Dust.  Smoke. This includes campfire smoke and secondhand smoke from tobacco products.  Pet dander.  Trees, grasses or pollens.  Very cold, dry, or humid air.  Mold.  Foods that contain high amounts of sulfites.  Strong smells.  Engine exhaust and air pollution.  Aerosol sprays and fumes from household cleaners.  Household pests and their droppings, including dust mites and cockroaches.  Certain medicines, including NSAIDs. Once you have determined your asthma triggers, take steps to avoid them. Depending on your triggers, you may be able to reduce the chance of an asthma attack by:  Keeping your home clean. Have someone dust and vacuum your home for you 1 or 2 times a week. If possible, have them use a high-efficiency particulate arrestance (HEPA) vacuum.  Washing your sheets weekly in hot water.  Using allergy-proof mattress covers and casings on your bed.  Keeping pets out of your home.  Taking care  of mold and water problems in your home.  Avoiding areas where people smoke.  Avoiding using strong perfumes or odor sprays.  Avoid spending a lot of time outdoors when pollen counts are high and on very windy days.  Talking with your health care provider before stopping or starting any new medicines. Medicines  Take over-the-counter and prescription medicines only as told by your health care  provider. Many asthma attacks can be prevented by carefully following your medicine schedule. Taking your medicines correctly is especially important when you cannot avoid certain asthma triggers. Even if you are doing well, do not stop taking your medicine and do not take less medicine. Act quickly  If an asthma attack happens, acting quickly can decrease how severe it is and how long it lasts. Take these actions:  Pay attention to your symptoms. If you are coughing, wheezing, or having difficulty breathing, do not wait to see if your symptoms go away on their own. Follow your asthma action plan.  If you have followed your asthma action plan and your symptoms are not improving, call your health care provider or seek immediate medical care at the nearest hospital. It is important to write down how often you need to use your fast-acting rescue inhaler. You can track how often you use an inhaler in your journal. If you are using your rescue inhaler more often, it may mean that your asthma is not under control. Adjusting your asthma treatment plan may help you to prevent future asthma attacks and help you to gain better control of your condition. How can I prevent an asthma attack when I exercise?   Exercise is a common asthma trigger. To prevent asthma attacks during exercise:  Follow advice from your health care provider about whether you should use your fast-acting inhaler before exercising. Many people with asthma experience exercise-induced bronchoconstriction (EIB). This condition often worsens during vigorous exercise in cold, humid, or dry environments. Usually, people with EIB can stay very active by using a fast-acting inhaler before exercising.  Avoid exercising outdoors in very cold or humid weather.  Avoid exercising outdoors when pollen counts are high.  Warm up and cool down when exercising.  Stop exercising right away if asthma symptoms start. Consider taking part in exercises that  are less likely to cause asthma symptoms such as:  Indoor swimming.  Biking.  Walking.  Hiking.  Playing football. This information is not intended to replace advice given to you by your health care provider. Make sure you discuss any questions you have with your health care provider. Document Released: 02/20/2009 Document Revised: 11/03/2015 Document Reviewed: 08/19/2015 Elsevier Interactive Patient Education  2017 [employer]. Allergic Rhinitis Allergic rhinitis is when the mucous membranes in the nose respond to allergens. Allergens are particles in the air that cause your body to have an allergic reaction. This causes you to release allergic antibodies. Through a chain of events, these eventually cause you to release histamine into the blood stream. Although meant to protect the body, it is this release of histamine that causes your discomfort, such as frequent sneezing, congestion, and an itchy, runny nose. What are the causes? Seasonal allergic rhinitis (hay fever) is caused by pollen allergens that may come from grasses, trees, and weeds. Year-round allergic rhinitis (perennial allergic rhinitis) is caused by allergens such as house dust mites, pet dander, and mold spores. What are the signs or symptoms?  Nasal stuffiness (congestion).  Itchy, runny nose with sneezing and tearing of the eyes.  How is this diagnosed? Your health care provider can help you determine the allergen or allergens that trigger your symptoms. If you and your health care provider are unable to determine the allergen, skin or blood testing may be used. Your health care provider will diagnose your condition after taking your health history and performing a physical exam. Your health care provider may assess you for other related conditions, such as asthma, pink eye, or an ear infection. How is this treated? Allergic rhinitis does not have a cure, but it can be controlled by:  Medicines that block allergy  symptoms. These may include allergy shots, nasal sprays, and oral antihistamines.  Avoiding the allergen. Hay fever may often be treated with antihistamines in pill or nasal spray forms. Antihistamines block the effects of histamine. There are over-the-counter medicines that may help with nasal congestion and swelling around the eyes. Check with your health care provider before taking or giving this medicine. If avoiding the allergen or the medicine prescribed do not work, there are many new medicines your health care provider can prescribe. Stronger medicine may be used if initial measures are ineffective. Desensitizing injections can be used if medicine and avoidance does not work. Desensitization is when a patient is given ongoing shots until the body becomes less sensitive to the allergen. Make sure you follow up with your health care provider if problems continue. Follow these instructions at home: It is not possible to completely avoid allergens, but you can reduce your symptoms by taking steps to limit your exposure to them. It helps to know exactly what you are allergic to so that you can avoid your specific triggers. Contact a health care provider if:  You have a fever.  You develop a cough that does not stop easily (persistent).  You have shortness of breath.  You start wheezing.  Symptoms interfere with normal daily activities. This information is not intended to replace advice given to you by your health care provider. Make sure you discuss any questions you have with your health care provider. Document Released: 11/27/2000 Document Revised: 11/03/2015 Document Reviewed: 11/09/2012 Elsevier Interactive Patient Education  2017 [employer]   IF you received an x-ray today, you will receive an invoice from [facility]. Please contact [facility] at [phone] with questions or concerns regarding your invoice.   IF you received labwork today, you will receive  an invoice from [facility]. Please contact [facility] at [phone] with questions or concerns regarding your invoice.   Our billing staff will not be able to assist you with questions regarding bills from these companies.  You will be contacted with the lab results as soon as they are available. The fastest way to get your results is to activate your My Chart account. Instructions are located on the last page of this paperwork. If you have not heard from us regarding the results in 2 weeks, please contact this office.

## 2016-08-29 ENCOUNTER — Encounter: Payer: Self-pay | Admitting: Urgent Care

## 2016-08-29 ENCOUNTER — Ambulatory Visit (INDEPENDENT_AMBULATORY_CARE_PROVIDER_SITE_OTHER): Payer: BLUE CROSS/BLUE SHIELD | Admitting: Urgent Care

## 2016-08-29 VITALS — BP 131/81 | HR 105 | Temp 97.9°F | Resp 18 | Ht 71.0 in | Wt 246.2 lb

## 2016-08-29 DIAGNOSIS — L299 Pruritus, unspecified: Secondary | ICD-10-CM

## 2016-08-29 DIAGNOSIS — L255 Unspecified contact dermatitis due to plants, except food: Secondary | ICD-10-CM | POA: Diagnosis not present

## 2016-08-29 MED ORDER — RANITIDINE HCL 150 MG PO TABS: 150.0000 mg | ORAL_TABLET | Freq: Two times a day (BID) | ORAL | 0 refills | Status: DC

## 2016-08-29 MED ORDER — CETIRIZINE HCL 10 MG PO TABS: 10.0000 mg | ORAL_TABLET | Freq: Every day | ORAL | 11 refills | Status: DC

## 2016-08-29 MED ORDER — TRIAMCINOLONE ACETONIDE 0.1 % EX CREA: 1.0000 "application " | TOPICAL_CREAM | Freq: Two times a day (BID) | CUTANEOUS | 0 refills | Status: DC

## 2016-08-29 NOTE — Patient Instructions (Addendum)
Poison Ivy Dermatitis Poison ivy dermatitis is inflammation of the skin that is caused by the allergens on the leaves of the poison ivy plant. The skin reaction often involves redness, swelling, blisters, and extreme itching. What are the causes? This condition is caused by a specific chemical (urushiol) found in the sap of the poison ivy plant. This chemical is sticky and can be easily spread to people, animals, and objects. You can get poison ivy dermatitis by:  Having direct contact with a poison ivy plant.  Touching animals, other people, or objects that have come in contact with poison ivy and have the chemical on them.  What increases the risk? This condition is more likely to develop in:  People who are outdoors often.  People who go outdoors without wearing protective clothing, such as closed shoes, long pants, and a long-sleeved shirt.  What are the signs or symptoms? Symptoms of this condition include:  Redness and itching.  A rash that often includes bumps and blisters. The rash usually appears 48 hours after exposure.  Swelling. This may occur if the reaction is more severe.  Symptoms usually last for 1-2 weeks. However, the first time you develop this condition, symptoms may last 3-4 weeks. How is this diagnosed? This condition may be diagnosed based on your symptoms and a physical exam. Your health care provider may also ask you about any recent outdoor activity. How is this treated? Treatment for this condition will vary depending on how severe it is. Treatment may include:  Hydrocortisone creams or calamine lotions to relieve itching.  Oatmeal baths to soothe the skin.  Over-the-counter antihistamine tablets.  Oral steroid medicine for more severe outbreaks.  Follow these instructions at home:  Take or apply over-the-counter and prescription medicines only as told by your health care provider.  Wash exposed skin as soon as possible with soap and cold  water.  Use hydrocortisone creams or calamine lotion as needed to soothe the skin and relieve itching.  Take oatmeal baths as needed. Use colloidal oatmeal. You can get this at your local pharmacy or grocery store. Follow the instructions on the packaging.  Do not scratch or rub your skin.  While you have the rash, wash clothes right after you wear them. How is this prevented?  Learn to identify the poison ivy plant and avoid contact with the plant. This plant can be recognized by the number of leaves. Generally, poison ivy has three leaves with flowering branches on a single stem. The leaves are typically glossy, and they have jagged edges that come to a point at the front.  If you have been exposed to poison ivy, thoroughly wash with soap and water right away. You have about 30 minutes to remove the plant resin before it will cause the rash. Be sure to wash under your fingernails because any plant resin there will continue to spread the rash.  When hiking or camping, wear clothes that will help you to avoid exposure on the skin. This includes long pants, a long-sleeved shirt, tall socks, and hiking boots. You can also apply preventive lotion to your skin to help limit exposure.  If you suspect that your clothes or outdoor gear came in contact with poison ivy, rinse them off outside with a garden hose before you bring them inside your house. Contact a health care provider if:  You have open sores in the rash area.  You have more redness, swelling, or pain in the affected area.  You have   redness that spreads beyond the rash area.  You have fluid, blood, or pus coming from the affected area.  You have a fever.  You have a rash over a large area of your body.  You have a rash on your eyes, mouth, or genitals.  Your rash does not improve after a few days. Get help right away if:  Your face swells or your eyes swell shut.  You have trouble breathing.  You have trouble  swallowing. This information is not intended to replace advice given to you by your health care provider. Make sure you discuss any questions you have with your health care provider. Document Released: 03/01/2000 Document Revised: 08/10/2015 Document Reviewed: 08/10/2014 Elsevier Interactive Patient Education  2018 Elsevier Inc.     IF you received an x-ray today, you will receive an invoice from [facility]. Please contact [facility] at [phone] with questions or concerns regarding your invoice.   IF you received labwork today, you will receive an invoice from [facility]. Please contact [facility] at [phone] with questions or concerns regarding your invoice.   Our billing staff will not be able to assist you with questions regarding bills from these companies.  You will be contacted with the lab results as soon as they are available. The fastest way to get your results is to activate your My Chart account. Instructions are located on the last page of this paperwork. If you have not heard from us regarding the results in 2 weeks, please contact this office.      

## 2016-08-29 NOTE — Progress Notes (Signed)
  MRN: [id number] DOB: [birth year]  Subjective:   Antonio Kelley is a 26 y.o. male presenting for chief complaint of Poison Ivy (on both arms/legs x1 week;)  Reports 1 week history of rash that started after coming into contact with plants. 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 Denies facial or genital involvement. Denies rash over torso, fever, chest tightness, n/v. Has used calamine lotion with little to no relief.   Antonio Kelley has a current medication list which includes the following prescription(s): albuterol sulfate, budesonide, and fluticasone. Also has No Known Allergies. Antonio Kelley  has a past medical history of Allergy; Asthma; and Hypertension. Also  has a past surgical history that includes Finger surgery.  Objective:   Vitals: BP 131/81   Pulse (!) 105   Temp 97.9 F (36.6 C) (Oral)   Resp 18   Ht 5\' 11"  (1.803 m)   Wt 246 lb 3.2 oz (111.7 kg)   SpO2 97%   BMI 34.34 kg/m   Physical Exam  Constitutional: He is oriented to person, place, and time. He appears well-developed and well-nourished.  HENT:  Mouth/Throat: Oropharynx is clear and moist.  Eyes: Right eye exhibits no discharge. Left eye exhibits no discharge.  Cardiovascular: Normal rate, regular rhythm and intact distal pulses.  Exam reveals no gallop and no friction rub.   No murmur heard. Pulmonary/Chest: Effort normal. No respiratory distress. He has no wheezes. He has no rales.  Neurological: He is alert and oriented to person, place, and time.  Skin: Skin is warm and dry. Rash (consistent with poison ivy given excoriations over forearms, lower extremities) noted.  Psychiatric: He has a normal mood and affect.   Assessment and Plan :   1. Contact dermatitis due to plant 2. Itching - Start Zyrtec and Zantac. Use Kenalog cream for the most bothersome itchy areas. Return-to-clinic precautions discussed, patient verbalized understanding.   Wallis BambergMario Laquiesha Piacente, PA-C Primary  Care at [facility] [phone] 08/29/2016  5:27 PM

## 2016-11-22 ENCOUNTER — Ambulatory Visit (INDEPENDENT_AMBULATORY_CARE_PROVIDER_SITE_OTHER): Payer: Managed Care, Other (non HMO) | Admitting: Urgent Care

## 2016-11-22 ENCOUNTER — Encounter: Payer: Self-pay | Admitting: Urgent Care

## 2016-11-22 VITALS — BP 131/77 | HR 80 | Temp 98.0°F | Resp 18 | Ht 71.0 in | Wt 266.8 lb

## 2016-11-22 DIAGNOSIS — R51 Headache: Secondary | ICD-10-CM | POA: Diagnosis not present

## 2016-11-22 DIAGNOSIS — R519 Headache, unspecified: Secondary | ICD-10-CM

## 2016-11-22 DIAGNOSIS — G4700 Insomnia, unspecified: Secondary | ICD-10-CM | POA: Diagnosis not present

## 2016-11-22 DIAGNOSIS — R635 Abnormal weight gain: Secondary | ICD-10-CM

## 2016-11-22 DIAGNOSIS — Z23 Encounter for immunization: Secondary | ICD-10-CM

## 2016-11-22 LAB — POCT GLYCOSYLATED HEMOGLOBIN (HGB A1C): HEMOGLOBIN A1C: 5.5

## 2016-11-22 MED ORDER — CELECOXIB 100 MG PO CAPS: 100.0000 mg | ORAL_CAPSULE | Freq: Two times a day (BID) | ORAL | 1 refills | Status: DC

## 2016-11-22 MED ORDER — TRAZODONE HCL 50 MG PO TABS: 50.0000 mg | ORAL_TABLET | Freq: Every evening | ORAL | 3 refills | Status: DC | PRN

## 2016-11-22 NOTE — Progress Notes (Signed)
MRN: [id number] DOB: 1990-08-31  Subjective:   Antonio Kelley is a 26 y.o. male presenting for chief complaint of Headache (x2 months; around every 2-3 days) and Immunizations (tdap)  Reports 2 month history of intermittent headaches. Headaches are dull in nature, frontal. Has started having mild occasional neck discomfort the past 2-3 days. Has tried Alleve otc without much relief. Admits that he is not getting restful sleep, wakes up multiple times a night and is sleeping ~6 hours a night now compared to 8 before he started having headaches. He is hydrating very well, eating regular meals. Has started drinking coffee due to feeling tired. Admits unhealthy diet and is not exercising consistently. He does recognize that he has started having higher stress levels and is feeling more and more anxious. He worries easily about "dumb things" and has a difficult time controlling his worries. Has not previously taken SSRI therapy. Uses e-cigs. Denies alcohol use. Denies fever, falls, trauma, dizziness, confusion, weakness, chest pain, shob, wheezing, n/v, abdominal pain, hematuria, lower leg swelling, bruising.  Antonio Kelley has a current medication list which includes the following prescription(s): albuterol sulfate, budesonide, cetirizine, fluticasone, ranitidine, and triamcinolone cream. Also has No Known Allergies.  Antonio Kelley  has a past medical history of Allergy; Asthma; and Hypertension. Also  has a past surgical history that includes Finger surgery.  Objective:   Vitals: BP 131/77   Pulse 80   Temp 98 F (36.7 C) (Oral)   Resp 18   Ht  (1.803 m)   Wt 266 lb 12.8 oz (121 kg)   SpO2 97%   BMI 37.21 kg/m   BP Readings from Last 3 Encounters:  11/22/16 131/77  08/29/16 131/81  08/08/16 113/79   Wt Readings from Last 3 Encounters:  11/22/16 266 lb 12.8 oz (121 kg)  08/29/16 246 lb 3.2 oz (111.7 kg)  08/08/16 234 lb 6.4 oz (106.3 kg)    Physical Exam  Constitutional: He is oriented to person,  place, and time. He appears well-developed and well-nourished.  HENT:  Mouth/Throat: Oropharynx is clear and moist.  Eyes: Pupils are equal, round, and reactive to light. No scleral icterus.  Neck: Normal range of motion. Neck supple. No thyromegaly present.  Cardiovascular: Normal rate, regular rhythm and intact distal pulses.  Exam reveals no gallop and no friction rub.   No murmur heard. Pulmonary/Chest: No respiratory distress. He has no wheezes. He has no rales.  Abdominal: Soft. Bowel sounds are normal. He exhibits no distension and no mass. There is no tenderness. There is no guarding.  Musculoskeletal: He exhibits no edema.  Lymphadenopathy:    He has no cervical adenopathy.  Neurological: He is alert and oriented to person, place, and time. He displays normal reflexes. No cranial nerve deficit. Coordination normal.  Skin: Skin is warm and dry.  Psychiatric: He has a normal mood and affect.   Results for orders placed or performed in visit on 11/22/16 (from the past 24 hour(s))  POCT glycosylated hemoglobin (Hb A1C)     Status: None   Collection Time: 11/22/16  3:40 PM  Result Value Ref Range   Hemoglobin A1C 5.5    Assessment and Plan :   1. Frequent headaches - Labs pending, will start conservative management, will address sleep as in #3. Will discuss starting SSRI therapy for anxiety and stress. - TSH  2. Weight gain - Tested negative for diabetes, recommended lifestyle changes. - Comprehensive metabolic panel - TSH - POCT glycosylated hemoglobin (Hb A1C)  3.  Insomnia, unspecified type - Start trazodone, labs pending  4. Need for Tdap vaccination - Tdap vaccine greater than or equal to 7yo IM   Hoa Briggs, PA-C [facility] [phone] 11/22/2016  2:18 PM

## 2016-11-22 NOTE — Patient Instructions (Addendum)
Trazodone tablets What is this medicine? TRAZODONE (TRAZ oh done) is used to treat depression. This medicine may be used for other purposes; ask your health care provider or pharmacist if you have questions. COMMON BRAND NAME(S): Desyrel What should I tell my health care provider before I take this medicine? They need to know if you have any of these conditions: -attempted suicide or thinking about it -bipolar disorder -bleeding problems -glaucoma -heart disease, or previous heart attack -irregular heart beat -kidney or liver disease -low levels of sodium in the blood -an unusual or allergic reaction to trazodone, other medicines, foods, dyes or preservatives -pregnant or trying to get pregnant -breast-feeding How should I use this medicine? Take this medicine by mouth with a glass of water. Follow the directions on the prescription label. Take this medicine shortly after a meal or a light snack. Take your medicine at regular intervals. Do not take your medicine more often than directed. Do not stop taking this medicine suddenly except upon the advice of your doctor. Stopping this medicine too quickly may cause serious side effects or your condition may worsen. A special MedGuide will be given to you by the pharmacist with each prescription and refill. Be sure to read this information carefully each time. Talk to your pediatrician regarding the use of this medicine in children. Special care may be needed. Overdosage: If you think you have taken too much of this medicine contact a poison control center or emergency room at once. NOTE: This medicine is only for you. Do not share this medicine with others. What if I miss a dose? If you miss a dose, take it as soon as you can. If it is almost time for your next dose, take only that dose. Do not take double or extra doses. What may interact with this medicine? Do not take this medicine with any of the following medications: -certain medicines  for fungal infections like fluconazole, itraconazole, ketoconazole, posaconazole, voriconazole -cisapride -dofetilide -dronedarone -linezolid -MAOIs like Carbex, Eldepryl, Marplan, Nardil, and Parnate -mesoridazine -methylene blue (injected into a vein) -pimozide -saquinavir -thioridazine -ziprasidone This medicine may also interact with the following medications: -alcohol -antiviral medicines for HIV or AIDS -aspirin and aspirin-like medicines -barbiturates like phenobarbital -certain medicines for blood pressure, heart disease, irregular heart beat -certain medicines for depression, anxiety, or psychotic disturbances -certain medicines for migraine headache like almotriptan, eletriptan, frovatriptan, naratriptan, rizatriptan, sumatriptan, zolmitriptan -certain medicines for seizures like carbamazepine and phenytoin -certain medicines for sleep -certain medicines that treat or prevent blood clots like dalteparin, enoxaparin, warfarin -digoxin -fentanyl -lithium -NSAIDS, medicines for pain and inflammation, like ibuprofen or naproxen -other medicines that prolong the QT interval (cause an abnormal heart rhythm) -rasagiline -supplements like St. John's wort, kava kava, valerian -tramadol -tryptophan This list may not describe all possible interactions. Give your health care provider a list of all the medicines, herbs, non-prescription drugs, or dietary supplements you use. Also tell them if you smoke, drink alcohol, or use illegal drugs. Some items may interact with your medicine. What should I watch for while using this medicine? Tell your doctor if your symptoms do not get better or if they get worse. Visit your doctor or health care professional for regular checks on your progress. Because it may take several weeks to see the full effects of this medicine, it is important to continue your treatment as prescribed by your doctor. Patients and their families should watch out for new  or worsening thoughts of suicide or depression. Also   watch out for sudden changes in feelings such as feeling anxious, agitated, panicky, irritable, hostile, aggressive, impulsive, severely restless, overly excited and hyperactive, or not being able to sleep. If this happens, especially at the beginning of treatment or after a change in dose, call your health care professional. Antonio Kelley may get drowsy or dizzy. Do not drive, use machinery, or do anything that needs mental alertness until you know how this medicine affects you. Do not stand or sit up quickly, especially if you are an older patient. This reduces the risk of dizzy or fainting spells. Alcohol may interfere with the effect of this medicine. Avoid alcoholic drinks. This medicine may cause dry eyes and blurred vision. If you wear contact lenses you may feel some discomfort. Lubricating drops may help. See your eye doctor if the problem does not go away or is severe. Your mouth may get dry. Chewing sugarless gum, sucking hard candy and drinking plenty of water may help. Contact your doctor if the problem does not go away or is severe. What side effects may I notice from receiving this medicine? Side effects that you should report to your doctor or health care professional as soon as possible: -allergic reactions like skin rash, itching or hives, swelling of the face, lips, or tongue -elevated mood, decreased need for sleep, racing thoughts, impulsive behavior -confusion -fast, irregular heartbeat -feeling faint or lightheaded, falls -feeling agitated, angry, or irritable -loss of balance or coordination -painful or prolonged erections -restlessness, pacing, inability to keep still -suicidal thoughts or other mood changes -tremors -trouble sleeping -seizures -unusual bleeding or bruising Side effects that usually do not require medical attention (report to your doctor or health care professional if they continue or are bothersome): -change in  sex drive or performance -change in appetite or weight -constipation -headache -muscle aches or pains -nausea This list may not describe all possible side effects. Call your doctor for medical advice about side effects. You may report side effects to FDA at 1-800-FDA-1088. Where should I keep my medicine? Keep out of the reach of children. Store at room temperature between 15 and 30 degrees C (59 to 86 degrees F). Protect from light. Keep container tightly closed. Throw away any unused medicine after the expiration date. NOTE: This sheet is a summary. It may not cover all possible information. If you have questions about this medicine, talk to your doctor, pharmacist, or health care provider.  2018 Elsevier/Gold Standard (2015-08-03 16:57:05)   Celecoxib capsules What is this medicine? CELECOXIB (sell a KOX ib) is a non-steroidal anti-inflammatory drug (NSAID). This medicine is used to treat arthritis and ankylosing spondylitis. It may be also used for pain or painful monthly periods. This medicine may be used for other purposes; ask your health care provider or pharmacist if you have questions. COMMON BRAND NAME(S): Celebrex What should I tell my health care provider before I take this medicine? They need to know if you have any of these conditions: -asthma -coronary artery bypass graft (CABG) surgery within the past 2 weeks -drink more than 3 alcohol-containing drinks a day -heart disease or circulation problems like heart failure or leg edema (fluid retention) -high blood pressure -kidney disease -liver disease -stomach bleeding or ulcers -an unusual or allergic reaction to celecoxib, sulfa drugs, aspirin, other NSAIDs, other medicines, foods, dyes, or preservatives -pregnant or trying to get pregnant -breast-feeding How should I use this medicine? Take this medicine by mouth with a full glass of water. Follow the directions on the prescription  label. Take it with food if it  upsets your stomach or if you take 400 mg at one time. Try to not lie down for at least 10 minutes after you take the medicine. Take the medicine at the same time each day. Do not take more medicine than you are told to take. Long-term, continuous use may increase the risk of heart attack or stroke. A special MedGuide will be given to you by the pharmacist with each prescription and refill. Be sure to read this information carefully each time. Talk to your pediatrician regarding the use of this medicine in children. Special care may be needed. Overdosage: If you think you have taken too much of this medicine contact a poison control center or emergency room at once. NOTE: This medicine is only for you. Do not share this medicine with others. What if I miss a dose? If you miss a dose, take it as soon as you can. If it is almost time for your next dose, take only that dose. Do not take double or extra doses. What may interact with this medicine? Do not take this medicine with any of the following medications: -cidofovir -methotrexate -other NSAIDs, medicines for pain and inflammation, like ibuprofen or naproxen -pemetrexed This medicine may also interact with the following medications: -alcohol -aspirin and aspirin-like drugs -diuretics -fluconazole -lithium -medicines for high blood pressure -steroid medicines like prednisone or cortisone -warfarin This list may not describe all possible interactions. Give your health care provider a list of all the medicines, herbs, non-prescription drugs, or dietary supplements you use. Also tell them if you smoke, drink alcohol, or use illegal drugs. Some items may interact with your medicine. What should I watch for while using this medicine? Tell your doctor or health care professional if your pain does not get better. Talk to your doctor before taking another medicine for pain. Do not treat yourself. This medicine does not prevent heart attack or  stroke. In fact, this medicine may increase the chance of a heart attack or stroke. The chance may increase with longer use of this medicine and in people who have heart disease. If you take aspirin to prevent heart attack or stroke, talk with your doctor or health care professional. Do not take medicines such as ibuprofen and naproxen with this medicine. Side effects such as stomach upset, nausea, or ulcers may be more likely to occur. Many medicines available without a prescription should not be taken with this medicine. This medicine can cause ulcers and bleeding in the stomach and intestines at any time during treatment. Ulcers and bleeding can happen without warning symptoms and can cause death. What side effects may I notice from receiving this medicine? Side effects that you should report to your doctor or health care professional as soon as possible: -allergic reactions like skin rash, itching or hives, swelling of the face, lips, or tongue -black or bloody stools, blood in the urine or vomit -blurred vision -breathing problems -chest pain -nausea, vomiting -problems with balance, talking, walking -redness, blistering, peeling or loosening of the skin, including inside the mouth -unexplained weight gain or swelling -unusually weak or tired -yellowing of eyes, skin Side effects that usually do not require medical attention (report to your doctor or health care professional if they continue or are bothersome): -constipation or diarrhea -dizziness -gas or heartburn -upset stomach This list may not describe all possible side effects. Call your doctor for medical advice about side effects. You may report side effects to  FDA at 1-800-FDA-1088. Where should I keep my medicine? Keep out of the reach of children. Store at room temperature between 15 and 30 degrees C (59 and 86 degrees F). Keep container tightly closed. Throw away any unused medicine after the expiration date. NOTE: This sheet  is a summary. It may not cover all possible information. If you have questions about this medicine, talk to your doctor, pharmacist, or health care provider.  2018 Elsevier/Gold Standard (2009-05-03 10:54:17)   IF you received an x-ray today, you will [facility] an [facility]. Please [facility] at [phone] with questions or concerns regarding your invoice.   IF you received labwork today, you will receive an invoice from [facility]. Please contact [facility] at [phone] with questions or concerns regarding your invoice.   Our billing staff will not be able to assist you with questions regarding bills from these companies.  You will be contacted with the lab results as soon as they are available. The fastest way to get your results is to activate your My Chart account. Instructions are located on the last page of this paperwork. If you have not heard from us regarding the results in 2 weeks, please contact this office.

## 2016-11-23 LAB — COMPREHENSIVE METABOLIC PANEL
A/G RATIO: 1.5 (ref 1.2–2.2)
ALT: 23 IU/L (ref 0–44)
AST: 21 IU/L (ref 0–40)
Albumin: 4.6 g/dL (ref 3.5–5.5)
Alkaline Phosphatase: 65 IU/L (ref 39–117)
BUN/Creatinine Ratio: 17 (ref 9–20)
BUN: 17 mg/dL (ref 6–20)
Bilirubin Total: 0.3 mg/dL (ref 0.0–1.2)
CALCIUM: 9.6 mg/dL (ref 8.7–10.2)
CO2: 21 mmol/L (ref 20–29)
CREATININE: 1 mg/dL (ref 0.76–1.27)
Chloride: 102 mmol/L (ref 96–106)
GFR, EST AFRICAN AMERICAN: 118 mL/min/{1.73_m2} (ref >59)
GFR, EST NON AFRICAN AMERICAN: 102 mL/min/{1.73_m2} (ref >59)
Globulin, Total: 3 g/dL (ref 1.5–4.5)
Glucose: 148 mg/dL — ABNORMAL HIGH (ref 65–99)
POTASSIUM: 3.9 mmol/L (ref 3.5–5.2)
Sodium: 142 mmol/L (ref 134–144)
TOTAL PROTEIN: 7.6 g/dL (ref 6.0–8.5)

## 2016-11-23 LAB — TSH: TSH: 1.54 u[IU]/mL (ref 0.450–4.500)

## 2016-12-23 ENCOUNTER — Other Ambulatory Visit: Payer: Self-pay | Admitting: Urgent Care

## 2016-12-23 DIAGNOSIS — M62830 Muscle spasm of back: Secondary | ICD-10-CM

## 2017-06-06 ENCOUNTER — Encounter: Payer: Self-pay | Admitting: Emergency Medicine

## 2017-06-06 ENCOUNTER — Other Ambulatory Visit: Payer: Self-pay

## 2017-06-06 ENCOUNTER — Ambulatory Visit (INDEPENDENT_AMBULATORY_CARE_PROVIDER_SITE_OTHER): Payer: Managed Care, Other (non HMO) | Admitting: Emergency Medicine

## 2017-06-06 VITALS — BP 137/78 | HR 98 | Temp 98.7°F | Resp 16 | Ht 71.0 in | Wt 278.2 lb

## 2017-06-06 DIAGNOSIS — R51 Headache: Secondary | ICD-10-CM

## 2017-06-06 DIAGNOSIS — J4521 Mild intermittent asthma with (acute) exacerbation: Secondary | ICD-10-CM | POA: Diagnosis not present

## 2017-06-06 DIAGNOSIS — R519 Headache, unspecified: Secondary | ICD-10-CM

## 2017-06-06 DIAGNOSIS — G43709 Chronic migraine without aura, not intractable, without status migrainosus: Secondary | ICD-10-CM

## 2017-06-06 DIAGNOSIS — G43009 Migraine without aura, not intractable, without status migrainosus: Secondary | ICD-10-CM | POA: Insufficient documentation

## 2017-06-06 MED ORDER — SUMATRIPTAN SUCCINATE 50 MG PO TABS: ORAL_TABLET | ORAL | 0 refills | Status: DC

## 2017-06-06 MED ORDER — BUDESONIDE 90 MCG/ACT IN AEPB: 1.0000 | INHALATION_SPRAY | Freq: Two times a day (BID) | RESPIRATORY_TRACT | 5 refills | Status: DC

## 2017-06-06 NOTE — Patient Instructions (Addendum)
     IF you received an x-ray today, you will receive an invoice from [facility]. Please contact [facility] at [phone] with questions or concerns regarding your invoice.   IF you received labwork today, you will receive an invoice from [facility]. Please contact [facility] at [phone] with questions or concerns regarding your invoice.   Our billing staff will not be able to assist you with questions regarding bills from these companies.  You will be contacted with the lab results as soon as they are available. The fastest way to get your results is to activate your My Chart account. Instructions are located on the last page of this paperwork. If you have not heard from us regarding the results in 2 weeks, please contact this office.     Migraine Headache A migraine headache is a very strong throbbing pain on one side or both sides of your head. Migraines can also cause other symptoms. Talk with your doctor about what things may bring on (trigger) your migraine headaches. Follow these instructions at home: Medicines  Take over-the-counter and prescription medicines only as told by your doctor.  Do not drive or use heavy machinery while taking prescription pain medicine.  To prevent or treat constipation while you are taking prescription pain medicine, your doctor may recommend that you: ? Drink enough fluid to keep your pee (urine) clear or pale yellow. ? Take over-the-counter or prescription medicines. ? Eat foods that are high in fiber. These include fresh fruits and vegetables, whole grains, and beans. ? Limit foods that are high in fat and processed sugars. These include fried and sweet foods. Lifestyle  Avoid alcohol.  Do not use any products that contain nicotine or tobacco, such as cigarettes and e-cigarettes. If you need help quitting, ask your doctor.  Get at least 8 hours of sleep every night.  Limit your stress. General instructions   Keep a  journal to find out what may bring on your migraines. For example, write down: ? What you eat and drink. ? How much sleep you get. ? Any change in what you eat or drink. ? Any change in your medicines.  If you have a migraine: ? Avoid things that make your symptoms worse, such as bright lights. ? It may help to lie down in a dark, quiet room. ? Do not drive or use heavy machinery. ? Ask your doctor what activities are safe for you.  Keep all follow-up visits as told by your doctor. This is important. Contact a doctor if:  You get a migraine that is different or worse than your usual migraines. Get help right away if:  Your migraine gets very bad.  You have a fever.  You have a stiff neck.  You have trouble seeing.  Your muscles feel weak or like you cannot control them.  You start to lose your balance a lot.  You start to have trouble walking.  You pass out (faint). This information is not intended to replace advice given to you by your health care provider. Make sure you discuss any questions you have with your health care provider. Document Released: 12/12/2007 Document Revised: 09/22/2015 Document Reviewed: 08/21/2015 Elsevier Interactive Patient Education  2018 [employer]  

## 2017-06-06 NOTE — Progress Notes (Signed)
Argie Applegate 27 y.o.   Chief Complaint  Patient presents with  . Migraine    started 06/06/2017 this morning  . Sore Throat    06/05/2017  . Medication Refill    Budesonide    HISTORY OF PRESENT ILLNESS: This is a 27 y.o. male with a history of migraine headaches complaining of atypical bad headache that started early this morning.  Better now but still present. Patient gets migraine headaches 2-3 times a month.  Has never seen a neurologist.  He is not on any prophylactic medication.  Once his mother-in-law gave him sumatriptan and it helped a great deal. No other significant symptomatology.  Migraine   This is a recurrent problem. The current episode started today. The problem occurs monthly. The problem has been waxing and waning. The pain is located in the right unilateral region. The pain does not radiate. The pain quality is similar to prior headaches. The quality of the pain is described as pulsating. The pain is moderate. Associated symptoms include photophobia and a sore throat. Pertinent negatives include no abdominal pain, blurred vision, coughing, dizziness, ear pain, eye pain, eye redness, eye watering, fever, loss of balance, muscle aches, nausea, neck pain, scalp tenderness, sinus pressure, swollen glands, tingling, visual change, vomiting or weakness. Nothing aggravates the symptoms. He has tried NSAIDs for the symptoms. The treatment provided no relief. His past medical history is significant for migraine headaches and migraines in the family.     Prior to Admission medications   Medication Sig Start Date End Date Taking? Authorizing Provider  Albuterol Sulfate (PROAIR RESPICLICK) 108 (90 Base) MCG/ACT AEPB Inhale 1 Inhaler into the lungs every 4 (four) hours as needed. 08/08/16  Yes Shade Flood, MD  celecoxib (CELEBREX) 100 MG capsule Take 1 capsule (100 mg total) by mouth 2 (two) times daily. 11/22/16  Yes Wallis Bamberg, PA-C  cyclobenzaprine (FLEXERIL) 5 MG tablet TAKE  1 TABLET BY MOUTH THREE TIMES DAILY AS NEEDED 12/26/16  Yes Wallis Bamberg, PA-C  fluticasone [facility] 50 MCG/ACT nasal spray Place 1-2 sprays into both nostrils daily. 08/08/16  Yes Shade Flood, MD  ranitidine (ZANTAC) 150 MG tablet Take 1 tablet (150 mg total) by mouth 2 (two) times daily. 08/29/16  Yes Wallis Bamberg, PA-C  Budesonide (PULMICORT FLEXHALER) 90 MCG/ACT inhaler Inhale 1-2 puffs into the lungs 2 (two) times daily. Patient not taking: Reported on 06/06/2017 08/08/16   Shade Flood, MD  cetirizine (ZYRTEC) 10 MG tablet Take 1 tablet (10 mg total) by mouth daily. Patient not taking: Reported on 06/06/2017 08/29/16   Wallis Bamberg, PA-C  traZODone (DESYREL) 50 MG tablet Take 1 tablet (50 mg total) by mouth at bedtime as needed for sleep. Patient not taking: Reported on 06/06/2017 11/22/16   Wallis Bamberg, PA-C    No Known Allergies  Patient Active Problem List   Diagnosis Date Noted  . Frequent headaches 11/22/2016    Past Medical History:  Diagnosis Date  . Allergy   . Asthma   . Hypertension     Past Surgical History:  Procedure Laterality Date  . FINGER SURGERY      Social History   Socioeconomic History  . Marital status: Single    Spouse name: Not on file  . Number of children: Not on file  . Years of education: Not on file  . Highest education level: Not on file  Occupational History  . Not on file  Social Needs  . Financial resource strain: Not on  file  . Food insecurity:    Worry: Not on file    Inability: Not on file  . Transportation needs:    Medical: Not on file    Non-medical: Not on file  Tobacco Use  . Smoking status: Former Games developer  . Smokeless tobacco: Former Engineer, water and Sexual Activity  . Alcohol use: No    Alcohol/week: 0.0 oz  . Drug use: No  . Sexual activity: Never  Lifestyle  . Physical activity:    Days per week: Not on file    Minutes per session: Not on file  . Stress: Not on file  Relationships  . Social connections:     Talks on phone: Not on file    Gets together: Not on file    Attends religious service: Not on file    Active member of club or organization: Not on file    Attends meetings of clubs or organizations: Not on file    Relationship status: Not on file  . Intimate partner violence:    Fear of current or ex partner: Not on file    Emotionally abused: Not on file    Physically abused: Not on file    Forced sexual activity: Not on file  Other Topics Concern  . Not on file  Social History Narrative  . Not on file    Family History  Problem Relation Age of Onset  . Cancer Mother   . Cancer Maternal Grandmother      Review of Systems  Constitutional: Negative.  Negative for chills, fever and malaise/fatigue.  HENT: Positive for sore throat. Negative for congestion, ear pain and sinus pressure.   Eyes: Positive for photophobia. Negative for blurred vision, double vision, pain and redness.  Respiratory: Negative.  Negative for cough and shortness of breath.   Cardiovascular: Negative.  Negative for chest pain and palpitations.  Gastrointestinal: Negative for abdominal pain, diarrhea, nausea and vomiting.  Genitourinary: Negative.   Musculoskeletal: Negative.  Negative for neck pain.  Skin: Negative.  Negative for rash.  Neurological: Positive for headaches. Negative for dizziness, tingling, sensory change, focal weakness, weakness and loss of balance.  Endo/Heme/Allergies: Negative.   All other systems reviewed and are negative.     Vitals:   06/06/17 1508  BP: 137/78  Pulse: 98  Resp: 16  Temp: 98.7 F (37.1 C)  SpO2: 95%    Physical Exam  Constitutional: He is oriented to person, place, and time. He appears well-developed and well-nourished.  HENT:  Head: Normocephalic and atraumatic.  Right Ear: External ear normal.  Left Ear: External ear normal.  Nose: Nose normal.  Mouth/Throat: Oropharynx is clear and moist.  Eyes: Pupils are equal, round, and reactive to  light. Conjunctivae and EOM are normal.  Neck: Normal range of motion. Neck supple. No thyromegaly present.  Cardiovascular: Normal rate, regular rhythm and normal heart sounds.  Pulmonary/Chest: Effort normal and breath sounds normal.  Abdominal: Soft. He exhibits no distension. There is no tenderness.  Musculoskeletal: Normal range of motion. He exhibits no edema or tenderness.  Lymphadenopathy:    He has no cervical adenopathy.  Neurological: He is alert and oriented to person, place, and time. He displays normal reflexes. No cranial nerve deficit or sensory deficit. He exhibits normal muscle tone. Coordination normal.  Skin: Skin is warm and dry. Capillary refill takes less than 2 seconds. No rash noted.  Psychiatric: He has a normal mood and affect. His behavior is normal.  Vitals reviewed.  ASSESSMENT & PLAN: [place] was seen today for migraine, sore throat and medication refill.  Diagnoses and all orders for this visit:  Acute nonintractable headache, unspecified headache type  Chronic migraine without aura without status migrainosus, not intractable -     Ambulatory referral to Neurology  Mild intermittent asthma with acute exacerbation -     Budesonide (PULMICORT FLEXHALER) 90 MCG/ACT inhaler; Inhale 1-2 puffs into the lungs 2 (two) times daily.  Other orders -     SUMAtriptan (IMITREX) 50 MG tablet; Take 50 mg at the onset of headache; may repeat every 2 hours but no more than 200 mg/24 hours.    Patient Instructions       IF you received an x-ray today, you will receive an invoice from [facility]. Please contact [facility] at [phone] with questions or concerns regarding your invoice.   IF you received labwork today, you will receive an invoice from [facility]. Please contact [facility] at [phone] with questions or concerns regarding your invoice.   Our billing staff will not be able to assist you with questions regarding bills from these  companies.  You will be contacted with the lab results as soon as they are available. The fastest way to get your results is to activate your My Chart account. Instructions are located on the last page of this paperwork. If you have not heard from [place] regarding the results in 2 weeks, please contact this office.     Migraine Headache A migraine headache is a very strong throbbing pain on one side or both sides of your head. Migraines can also cause other symptoms. Talk with your doctor about what things may bring on (trigger) your migraine headaches. Follow these instructions at home: Medicines  Take over-the-counter and prescription medicines only as told by your doctor.  Do not drive or use heavy machinery while taking prescription pain medicine.  To prevent or treat constipation while you are taking prescription pain medicine, your doctor may recommend that you: ? Drink enough fluid to keep your pee (urine) clear or pale yellow. ? Take over-the-counter or prescription medicines. ? Eat foods that are high in fiber. These include fresh fruits and vegetables, whole grains, and beans. ? Limit foods that are high in fat and processed sugars. These include fried and sweet foods. Lifestyle  Avoid alcohol.  Do not use any products that contain nicotine or tobacco, such as cigarettes and e-cigarettes. If you need help quitting, ask your doctor.  Get at least 8 hours of sleep every night.  Limit your stress. General instructions   Keep a journal to find out what may bring on your migraines. For example, write down: ? What you eat and drink. ? How much sleep you get. ? Any change in what you eat or drink. ? Any change in your medicines.  If you have a migraine: ? Avoid things that make your symptoms worse, such as bright lights. ? It may help to lie down in a dark, quiet room. ? Do not drive or use heavy machinery. ? Ask your doctor what activities are safe for you.  Keep all  follow-up visits as told by your doctor. This is important. Contact a doctor if:  You get a migraine that is different or worse than your usual migraines. Get help right away if:  Your migraine gets very bad.  You have a fever.  You have a stiff neck.  You have trouble seeing.  Your muscles feel weak or like  you cannot control them.  You start to lose your balance a lot.  You start to have trouble walking.  You pass out (faint). This information is not intended to replace advice given to you by your health care provider. Make sure you discuss any questions you have with your health care provider. Document Released: 12/12/2007 Document Revised: 09/22/2015 Document Reviewed: 08/21/2015 Elsevier Interactive Patient Education  2018 Elsevier Inc.      Edwina BarthMiguel Jameyah Fennewald, MD [facility]

## 2017-06-25 ENCOUNTER — Encounter: Payer: Self-pay | Admitting: Physician Assistant

## 2017-08-21 ENCOUNTER — Ambulatory Visit: Payer: Managed Care, Other (non HMO) | Admitting: Neurology

## 2017-08-21 ENCOUNTER — Telehealth: Payer: Self-pay | Admitting: Neurology

## 2017-08-21 ENCOUNTER — Encounter: Payer: Self-pay | Admitting: Neurology

## 2017-08-21 VITALS — BP 115/74 | HR 68 | Ht 71.0 in | Wt 272.0 lb

## 2017-08-21 DIAGNOSIS — G43009 Migraine without aura, not intractable, without status migrainosus: Secondary | ICD-10-CM

## 2017-08-21 DIAGNOSIS — R51 Headache with orthostatic component, not elsewhere classified: Secondary | ICD-10-CM

## 2017-08-21 DIAGNOSIS — R0683 Snoring: Secondary | ICD-10-CM

## 2017-08-21 DIAGNOSIS — G4719 Other hypersomnia: Secondary | ICD-10-CM

## 2017-08-21 DIAGNOSIS — H539 Unspecified visual disturbance: Secondary | ICD-10-CM

## 2017-08-21 DIAGNOSIS — R0681 Apnea, not elsewhere classified: Secondary | ICD-10-CM | POA: Diagnosis not present

## 2017-08-21 DIAGNOSIS — R519 Headache, unspecified: Secondary | ICD-10-CM

## 2017-08-21 MED ORDER — ONDANSETRON 4 MG PO TBDP: 4.0000 mg | ORAL_TABLET | Freq: Three times a day (TID) | ORAL | 3 refills | Status: DC | PRN

## 2017-08-21 MED ORDER — RIZATRIPTAN BENZOATE 10 MG PO TBDP: 10.0000 mg | ORAL_TABLET | ORAL | 11 refills | Status: DC | PRN

## 2017-08-21 NOTE — Progress Notes (Signed)
[facility]    Provider:  Dr Lucia Gaskins Referring Provider: Cc: Dr. Alvy Bimler Primary Care Physician:  Cc: Dr. Alvy Bimler  CC:  Migraines  HPI:  Antonio Kelley is a 27 y.o. male here as a referral from Dr. Alvy Bimler. Mother has migraines. He has had migraines occ. 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 Started a year ago with headaches every day. Daily headaches, he wakes up with them daily. He is excessivsely tired, snores excessively, wife is here and confirms witnessed apneic events. Can nod off during the day. The daily headaches are dull, wakes up with them and gets better during the day. These headaches are daily. But also has migraines, this month he had 2 migraine days, sharp and throbbing pain around the eyes, starts unilaterally, light and sound sensitivity, nausea and vomiting, has to sit still in a dark room can last all day if untreated. Sumatriptan helps. No OTC medication overuse. No aura. He has blurry vision with the severe head pain. No hearing changes or numbness/tingling. Headaches worse bending over and with valsalva.   Reviewed notes, labs and imaging from outside physicians, which showed:   TSH nml, cmp with bun.cr 17/1  Review of Systems: Patient complains of symptoms per HPI as well as the following symptoms: blurred vision. Pertinent negatives and positives per HPI. All others negative.   Social History   Socioeconomic History  . Marital status: Significant Other    Spouse name: Not on file  . Number of children: 1  . Years of education: Not on file  . Highest education level: Not on file  Occupational History  . Not on file  Social Needs  . Financial resource strain: Not on file  . Food insecurity:    Worry: Not on file    Inability: Not on file  . Transportation needs:    Medical: Not on file    Non-medical: Not on file  Tobacco Use  . Smoking  status: Former Games developer  . Smokeless tobacco: Former Engineer, water and Sexual Activity  . Alcohol use: No    Alcohol/week: 0.0 oz  . Drug use: No  . Sexual activity: Never  Lifestyle  . Physical activity:    Days per week: Not on file    Minutes per session: Not on file  . Stress: Not on file  Relationships  . Social connections:    Talks on phone: Not on file    Gets together: Not on file    Attends religious service: Not on file    Active member of club or organization: Not on file    Attends meetings of clubs or organizations: Not on file    Relationship status: Not on file  . Intimate partner violence:    Fear of current or ex partner: Not on file    Emotionally abused: Not on file    Physically abused: Not on file    Forced sexual activity: Not on file  Other Topics Concern  . Not on file  Social History Narrative   Lives at home with his significant other   Caffeine: 1-2 cups daily    Family History  Problem Relation Age of Onset  . Cancer Mother   . Migraines Mother   . Cancer Maternal Grandmother     Past Medical History:  Diagnosis Date  . Allergy   . Asthma   . Hypertension  Past Surgical History:  Procedure Laterality Date  . FINGER SURGERY      Current Outpatient Medications  Medication Sig Dispense Refill  . Albuterol Sulfate (PROAIR RESPICLICK) 108 (90 Base) MCG/ACT AEPB Inhale 1 Inhaler into the lungs every 4 (four) hours as needed. (Patient taking differently: Inhale 2 puffs into the lungs every 4 (four) hours as needed. ) 1 each 0  . Budesonide (PULMICORT FLEXHALER) 90 MCG/ACT inhaler Inhale 1-2 puffs into the lungs 2 (two) times daily. 1 Inhaler 5  . cetirizine (ZYRTEC) 10 MG tablet Take 1 tablet (10 mg total) by mouth daily. 30 tablet 11  . cyclobenzaprine (FLEXERIL) 5 MG tablet TAKE 1 TABLET BY MOUTH THREE TIMES DAILY AS NEEDED 90 tablet 1  . ranitidine (ZANTAC) 150 MG tablet Take 1 tablet (150 mg total) by mouth 2 (two) times daily. 60  tablet 0  . ondansetron (ZOFRAN-ODT) 4 MG disintegrating tablet Take 1 tablet (4 mg total) by mouth every 8 (eight) hours as needed for nausea. 30 tablet 3  . rizatriptan (MAXALT-MLT) 10 MG disintegrating tablet Take 1 tablet (10 mg total) by mouth as needed for migraine. May repeat in 2 hours if needed 9 tablet 11   No current facility-administered medications for this visit.     Allergies as of 08/21/2017  . (No Known Allergies)    Vitals: BP 115/74 (BP Location: Right Arm, Patient Position: Sitting)   Pulse 68   Ht 5\' 11"  (1.803 m)   Wt 272 lb (123.4 kg)   BMI 37.94 kg/m  Last Weight:  Wt Readings from Last 1 Encounters:  08/21/17 272 lb (123.4 kg)   Last Height:   Ht Readings from Last 1 Encounters:  08/21/17 5\' 11"  (1.803 m)    Physical exam: Exam: Gen: NAD, conversant, well nourised, obese, well groomed                     CV: RRR, no MRG. No Carotid Bruits. No peripheral edema, warm, nontender Eyes: Conjunctivae clear without exudates or hemorrhage  Neuro: Detailed Neurologic Exam  Speech:    Speech is normal; fluent and spontaneous with normal comprehension.  Cognition:    The patient is oriented to person, place, and time;     recent and remote memory intact;     language fluent;     normal attention, concentration,     fund of knowledge Cranial Nerves:    The pupils are equal, round, and reactive to light. The fundi are normal and spontaneous venous pulsations are present. Visual fields are full to finger confrontation. Extraocular movements are intact. Trigeminal sensation is intact and the muscles of mastication are normal. The face is symmetric. The palate elevates in the midline. Hearing intact. Voice is normal. Shoulder shrug is normal. The tongue has normal motion without fasciculations.   Coordination:    Normal finger to nose and heel to shin. Normal rapid alternating movements.   Gait:    Heel-toe and tandem gait are normal.   Motor  Observation:    No asymmetry, no atrophy, and no involuntary movements noted. Tone:    Normal muscle tone.    Posture:    Posture is normal. normal erect    Strength:    Strength is V/V in the upper and lower limbs.      Sensation: intact to LT     Reflex Exam:  DTR's:    Deep tendon reflexes in the upper and lower extremities are normal bilaterally.  Toes:    The toes are downgoing bilaterally.   Clonus:    Clonus is absent.      Assessment/Plan:  27 year old with episodic migraines but daily headaches likely due to sleep apnea ESS 15, snores excessively with apneic events  MRI of the brain w/wo contrast due to concerning symptoms of morning headache, positional headaches,worse with valsalva to evaluate for space-occupying masses, chiari,   ESS 15  Needs a sleep study will order  At onset of headache take Rizatriptan and Ondansetron: Please take one tablet at the onset of your headache. If it does not improve the symptoms please take one additional tablet. Do not take more then 2 tablets in 24hrs. Do not take use more then 2 to 3 times in a week  Orders Placed This Encounter  Procedures  . MR BRAIN W WO CONTRAST  . Basic Metabolic Panel  . Ambulatory referral to Sleep Studies    Discussed: To prevent or relieve headaches, try the following: Cool Compress. Lie down and place a cool compress on your head.  Avoid headache triggers. If certain foods or odors seem to have triggered your migraines in the past, avoid them. A headache diary might help you identify triggers.  Include physical activity in your daily routine. Try a daily walk or other moderate aerobic exercise.  Manage stress. Find healthy ways to cope with the stressors, such as delegating tasks on your to-do list.  Practice relaxation techniques. Try deep breathing, yoga, massage and visualization.  Eat regularly. Eating regularly scheduled meals and maintaining a healthy diet might help prevent headaches.  Also, drink plenty of fluids.  Follow a regular sleep schedule. Sleep deprivation might contribute to headaches Consider biofeedback. With this mind-body technique, you learn to control certain bodily functions - such as muscle tension, heart rate and blood pressure - to prevent headaches or reduce headache pain.    Proceed to emergency room if you experience new or worsening symptoms or symptoms do not resolve, if you have new neurologic symptoms or if headache is severe, or for any concerning symptom.   Provided education and documentation from American headache Society toolbox including articles on: chronic migraine medication overuse headache, chronic migraines, prevention of migraines, behavioral and other nonpharmacologic treatments for headache.  Cc: Dr. Darrick Grinder, MD  [facility] [address] [place], [place] [address]  Phone [phone] Fax [phone]

## 2017-08-21 NOTE — Patient Instructions (Addendum)
At onset of headache take Rizatriptan and Ondansetron: Please take one tablet at the onset of your headache. If it does not improve the symptoms please take one additional tablet. Do not take more then 2 tablets in 24hrs. Do not take use more then 2 to 3 times in a week.  MRI brain w/wo contrast  Sleep evaluation   Sleep Apnea Sleep apnea is a condition in which breathing pauses or becomes shallow during sleep. Episodes of sleep apnea usually last 10 seconds or longer, and they may occur as many as 20 times an hour. Sleep apnea disrupts your sleep and keeps your body from getting the rest that it needs. This condition can increase your risk of certain health problems, including:  Heart attack.  Stroke.  Obesity.  Diabetes.  Heart failure.  Irregular heartbeat.  There are three kinds of sleep apnea:  Obstructive sleep apnea. This kind is caused by a blocked or collapsed airway.  Central sleep apnea. This kind happens when the part of the brain that controls breathing does not send the correct signals to the muscles that control breathing.  Mixed sleep apnea. This is a combination of obstructive and central sleep apnea.  What are the causes? The most common cause of this condition is a collapsed or blocked airway. An airway can collapse or become blocked if:  Your throat muscles are abnormally relaxed.  Your tongue and tonsils are larger than normal.  You are overweight.  Your airway is smaller than normal.  What increases the risk? This condition is more likely to develop in people who:  Are overweight.  Smoke.  Have a smaller than normal airway.  Are elderly.  Are male.  Drink alcohol.  Take sedatives or tranquilizers.  Have a family history of sleep apnea.  What are the signs or symptoms? Symptoms of this condition include:  Trouble staying asleep.  Daytime sleepiness and tiredness.  Irritability.  Loud snoring.  Morning headaches.  Trouble  concentrating.  Forgetfulness.  Decreased interest in sex.  Unexplained sleepiness.  Mood swings.  Personality changes.  Feelings of depression.  Waking up often during the night to urinate.  Dry mouth.  Sore throat.  How is this diagnosed? This condition may be diagnosed with:  A medical history.  A physical exam.  A series of tests that are done while you are sleeping (sleep study). These tests are usually done in a sleep lab, but they may also be done at home.  How is this treated? Treatment for this condition aims to restore normal breathing and to ease symptoms during sleep. It may involve managing health issues that can affect breathing, such as high blood pressure or obesity. Treatment may include:  Sleeping on your side.  Using a decongestant if you have nasal congestion.  Avoiding the use of depressants, including alcohol, sedatives, and narcotics.  Losing weight if you are overweight.  Making changes to your diet.  Quitting smoking.  Using a device to open your airway while you sleep, such as: ? An oral appliance. This is a custom-made mouthpiece that shifts your lower jaw forward. ? A continuous positive airway pressure (CPAP) device. This device delivers oxygen to your airway through a mask. ? A nasal expiratory positive airway pressure (EPAP) device. This device has valves that you put into each nostril. ? A bi-level positive airway pressure (BPAP) device. This device delivers oxygen to your airway through a mask.  Surgery if other treatments do not work. During surgery, excess  tissue is removed to create a wider airway.  It is important to get treatment for sleep apnea. Without treatment, this condition can lead to:  High blood pressure.  Coronary artery disease.  (Men) An inability to achieve or maintain an erection (impotence).  Reduced thinking abilities.  Follow these instructions at home:  Make any lifestyle changes that your health  care provider recommends.  Eat a healthy, well-balanced diet.  Take over-the-counter and prescription medicines only as told by your health care provider.  Avoid using depressants, including alcohol, sedatives, and narcotics.  Take steps to lose weight if you are overweight.  If you were given a device to open your airway while you sleep, use it only as told by your health care provider.  Do not use any tobacco products, such as cigarettes, chewing tobacco, and e-cigarettes. If you need help quitting, ask your health care provider.  Keep all follow-up visits as told by your health care provider. This is important. Contact a health care provider if:  The device that you received to open your airway during sleep is uncomfortable or does not seem to be working.  Your symptoms do not improve.  Your symptoms get worse. Get help right away if:  You develop chest pain.  You develop shortness of breath.  You develop discomfort in your back, arms, or stomach.  You have trouble speaking.  You have weakness on one side of your body.  You have drooping in your face. These symptoms may represent a serious problem that is an emergency. Do not wait to see if the symptoms will go away. Get medical help right away. Call your local emergency services (911 in the [place] Do not drive yourself to the hospital. This information is not intended to replace advice given to you by your health care provider. Make sure you discuss any questions you have with your health care provider. Document Released: 02/22/2002 Document Revised: 10/29/2015 Document Reviewed: 12/12/2014 Elsevier Interactive Patient Education  2018 [employer]    Migraine Headache A migraine headache is an intense, throbbing pain on one side or both sides of the head. Migraines may also cause other symptoms, such as nausea, vomiting, and sensitivity to light and noise. What are the causes? Doing or taking certain things may also  trigger migraines, such as:  Alcohol.  Smoking.  Medicines, such as: ? Medicine used to treat chest pain (nitroglycerine). ? Birth control pills. ? Estrogen pills. ? Certain blood pressure medicines.  Aged cheeses, chocolate, or caffeine.  Foods or drinks that contain nitrates, glutamate, aspartame, or tyramine.  Physical activity.  Other things that may trigger a migraine include:  Menstruation.  Pregnancy.  Hunger.  Stress, lack of sleep, too much sleep, or fatigue.  Weather changes.  What increases the risk? The following factors may make you more likely to experience migraine headaches:  Age. Risk increases with age.  Family history of migraine headaches.  Being Caucasian.  Depression and anxiety.  Obesity.  Being a woman.  Having a hole in the heart (patent foramen ovale) or other heart problems.  What are the signs or symptoms? The main symptom of this condition is pulsating or throbbing pain. Pain may:  Happen in any area of the head, such as on one side or both sides.  Interfere with daily activities.  Get worse with physical activity.  Get worse with exposure to bright lights or loud noises.  Other symptoms may include:  Nausea.  Vomiting.  Dizziness.  General  sensitivity to bright lights, loud noises, or smells.  Before you get a migraine, you may get warning signs that a migraine is developing (aura). An aura may include:  Seeing flashing lights or having blind spots.  Seeing bright spots, halos, or zigzag lines.  Having tunnel vision or blurred vision.  Having numbness or a tingling feeling.  Having trouble talking.  Having muscle weakness.  How is this diagnosed? A migraine headache can be diagnosed based on:  Your symptoms.  A physical exam.  Tests, such as CT scan or MRI of the head. These imaging tests can help rule out other causes of headaches.  Taking fluid from the spine (lumbar puncture) and analyzing it  (cerebrospinal fluid analysis, or CSF analysis).  How is this treated? A migraine headache is usually treated with medicines that:  Relieve pain.  Relieve nausea.  Prevent migraines from coming back.  Treatment may also include:  Acupuncture.  Lifestyle changes like avoiding foods that trigger migraines.  Follow these instructions at home: Medicines  Take over-the-counter and prescription medicines only as told by your health care provider.  Do not drive or use heavy machinery while taking prescription pain medicine.  To prevent or treat constipation while you are taking prescription pain medicine, your health care provider may recommend that you: ? Drink enough fluid to keep your urine clear or pale yellow. ? Take over-the-counter or prescription medicines. ? Eat foods that are high in fiber, such as fresh fruits and vegetables, whole grains, and beans. ? Limit foods that are high in fat and processed sugars, such as fried and sweet foods. Lifestyle  Avoid alcohol use.  Do not use any products that contain nicotine or tobacco, such as cigarettes and e-cigarettes. If you need help quitting, ask your health care provider.  Get at least 8 hours of sleep every night.  Limit your stress. General instructions   Keep a journal to find out what may trigger your migraine headaches. For example, write down: ? What you eat and drink. ? How much sleep you get. ? Any change to your diet or medicines.  If you have a migraine: ? Avoid things that make your symptoms worse, such as bright lights. ? It may help to lie down in a dark, quiet room. ? Do not drive or use heavy machinery. ? Ask your health care provider what activities are safe for you while you are experiencing symptoms.  Keep all follow-up visits as told by your health care provider. This is important. Contact a health care provider if:  You develop symptoms that are different or more severe than your usual  migraine symptoms. Get help right away if:  Your migraine becomes severe.  You have a fever.  You have a stiff neck.  You have vision loss.  Your muscles feel weak or like you cannot control them.  You start to lose your balance often.  You develop trouble walking.  You faint. This information is not intended to replace advice given to you by your health care provider. Make sure you discuss any questions you have with your health care provider. Document Released: 03/04/2005 Document Revised: 09/22/2015 Document Reviewed: 08/21/2015 Elsevier Interactive Patient Education  2017 [employer].

## 2017-08-21 NOTE — Telephone Encounter (Signed)
[employer] order sent to [facility]. They obtain the auth and will reach out to the pt to schedule.  °

## 2017-08-21 NOTE — Progress Notes (Signed)
Epworth Sleepiness Scale 0= would never doze 1= slight chance of dozing 2= moderate chance of dozing 3= high chance of dozing  Sitting and reading: 2 Watching TV: 3 Sitting inactive in a public place (ex. Theater or meeting): 3 As a passenger in a car for an hour without a break: 1 Lying down to rest in the afternoon: 3 Sitting and talking to someone: 1 Sitting quietly after lunch (no alcohol): 2 In a car, while stopped in traffic: 0 Total: 15

## 2017-08-22 ENCOUNTER — Telehealth: Payer: Self-pay | Admitting: *Deleted

## 2017-08-22 LAB — BASIC METABOLIC PANEL
BUN / CREAT RATIO: 13 (ref 9–20)
BUN: 13 mg/dL (ref 6–20)
CALCIUM: 9.4 mg/dL (ref 8.7–10.2)
CHLORIDE: 105 mmol/L (ref 96–106)
CO2: 21 mmol/L (ref 20–29)
Creatinine, Ser: 0.98 mg/dL (ref 0.76–1.27)
GFR, EST AFRICAN AMERICAN: 120 mL/min/{1.73_m2} (ref >59)
GFR, EST NON AFRICAN AMERICAN: 104 mL/min/{1.73_m2} (ref >59)
Glucose: 101 mg/dL — ABNORMAL HIGH (ref 65–99)
Potassium: 4 mmol/L (ref 3.5–5.2)
SODIUM: 140 mmol/L (ref 134–144)

## 2017-08-22 NOTE — Telephone Encounter (Signed)
-----   Message from Anson FretAntonia B Ahern, MD sent at 08/22/2017 11:18 AM EDT ----- Lab normal

## 2017-08-22 NOTE — Telephone Encounter (Signed)
Called pt & informed him that his labs are normal. Pt verbalized understanding and appreciation.

## 2017-09-23 ENCOUNTER — Encounter: Payer: Self-pay | Admitting: Family Medicine

## 2017-09-23 ENCOUNTER — Other Ambulatory Visit: Payer: Self-pay

## 2017-09-23 ENCOUNTER — Ambulatory Visit (INDEPENDENT_AMBULATORY_CARE_PROVIDER_SITE_OTHER): Payer: Managed Care, Other (non HMO) | Admitting: Family Medicine

## 2017-09-23 VITALS — BP 134/86 | HR 91 | Temp 98.6°F | Ht 71.0 in | Wt 262.4 lb

## 2017-09-23 DIAGNOSIS — R1032 Left lower quadrant pain: Secondary | ICD-10-CM

## 2017-09-23 DIAGNOSIS — R110 Nausea: Secondary | ICD-10-CM

## 2017-09-23 DIAGNOSIS — R634 Abnormal weight loss: Secondary | ICD-10-CM | POA: Diagnosis not present

## 2017-09-23 DIAGNOSIS — K529 Noninfective gastroenteritis and colitis, unspecified: Secondary | ICD-10-CM | POA: Diagnosis not present

## 2017-09-23 LAB — POCT CBC
GRANULOCYTE PERCENT: 70.6 % (ref 37–80)
HEMATOCRIT: 47.3 % (ref 43.5–53.7)
HEMOGLOBIN: 15.6 g/dL (ref 14.1–18.1)
LYMPH, POC: 2.1 (ref 0.6–3.4)
MCH, POC: 28.3 pg (ref 27–31.2)
MCHC: 33 g/dL (ref 31.8–35.4)
MCV: 85.5 fL (ref 80–97)
MID (cbc): 0.4 (ref 0–0.9)
MPV: 8.9 fL (ref 0–99.8)
POC GRANULOCYTE: 6.1 (ref 2–6.9)
POC LYMPH PERCENT: 24.6 %L (ref 10–50)
POC MID %: 4.8 %M (ref 0–12)
Platelet Count, POC: 229 10*3/uL (ref 142–424)
RBC: 5.54 M/uL (ref 4.69–6.13)
RDW, POC: 13.3 %
WBC: 8.6 10*3/uL (ref 4.6–10.2)

## 2017-09-23 LAB — POCT URINALYSIS DIP (MANUAL ENTRY)
Bilirubin, UA: NEGATIVE
GLUCOSE UA: NEGATIVE mg/dL
Ketones, POC UA: NEGATIVE mg/dL
LEUKOCYTES UA: NEGATIVE
NITRITE UA: NEGATIVE
RBC UA: NEGATIVE
Spec Grav, UA: 1.025 (ref 1.010–1.025)
UROBILINOGEN UA: 1 U/dL
pH, UA: 6 (ref 5.0–8.0)

## 2017-09-23 LAB — GLUCOSE, POCT (MANUAL RESULT ENTRY): POC Glucose: 73 mg/dl (ref 70–99)

## 2017-09-23 MED ORDER — ONDANSETRON 4 MG PO TBDP: 4.0000 mg | ORAL_TABLET | Freq: Three times a day (TID) | ORAL | 0 refills | Status: DC | PRN

## 2017-09-23 NOTE — Patient Instructions (Addendum)
Restart zantac twice per day for now and avoid trigger foods below for heartburn Fluids, and bland food for now.  Fluids are most important.   Zofran if needed for nausea.   Blood sugar, infection fighting cells on blood counts, and urine tests were overall reassuring.  I will send off the pancreas test, liver test and electrolytes as we discussed.  I suspect you may have a viral gastroenteritis that should start to improve in the next few days.  Follow-up with me in the next 1 week if not improving, sooner if worse.   Return to the clinic or go to the nearest emergency room if any of your symptoms worsen or new symptoms occur.  Food Choices for Gastroesophageal Reflux Disease, Adult When you have gastroesophageal reflux disease (GERD), the foods you eat and your eating habits are very important. Choosing the right foods can help ease your discomfort. What guidelines do I need to follow?  Choose fruits, vegetables, whole grains, and low-fat dairy products.  Choose low-fat meat, fish, and poultry.  Limit fats such as oils, salad dressings, butter, nuts, and avocado.  Keep a food diary. This helps you identify foods that cause symptoms.  Avoid foods that cause symptoms. These may be different for everyone.  Eat small meals often instead of 3 large meals a day.  Eat your meals slowly, in a place where you are relaxed.  Limit fried foods.  Cook foods using methods other than frying.  Avoid drinking alcohol.  Avoid drinking large amounts of liquids with your meals.  Avoid bending over or lying down until 2-3 hours after eating. What foods are not recommended? These are some foods and drinks that may make your symptoms worse: Vegetables Tomatoes. Tomato juice. Tomato and spaghetti sauce. Chili peppers. Onion and garlic. Horseradish. Fruits Oranges, grapefruit, and lemon (fruit and juice). Meats High-fat meats, fish, and poultry. This includes hot dogs, ribs, ham, sausage,  salami, and bacon. Dairy Whole milk and chocolate milk. Sour cream. Cream. Butter. Ice cream. Cream cheese. Drinks Coffee and tea. Bubbly (carbonated) drinks or energy drinks. Condiments Hot sauce. Barbecue sauce. Sweets/Desserts Chocolate and cocoa. Donuts. Peppermint and spearmint. Fats and Oils High-fat foods. This includes [place] fries and potato chips. Other Vinegar. Strong spices. This includes black pepper, white pepper, red pepper, cayenne, curry powder, cloves, ginger, and chili powder. The items listed above may not be a complete list of foods and drinks to avoid. Contact your dietitian for more information. This information is not intended to replace advice given to you by your health care provider. Make sure you discuss any questions you have with your health care provider. Document Released: 09/03/2011 Document Revised: 08/10/2015 Document Reviewed: 01/06/2013 Elsevier Interactive Patient Education  2017 [employer]    IF you received an x-ray today, you will [facility] an [facility]. Please contact [facility] at [phone] with questions or concerns regarding your invoice.   IF you received labwork today, you will receive an invoice from [facility]. Please contact [facility] at [phone] with questions or concerns regarding your invoice.   Our billing staff will not be able to assist you with questions regarding bills from these companies.  You will be contacted with the lab results as soon as they are available. The fastest way to get your results is to activate your My Chart account. Instructions are located on the last page of this paperwork. If you have not heard from us regarding the results in 2 weeks, please contact  this office.

## 2017-09-23 NOTE — Progress Notes (Signed)
Subjective:  By signing my name below, I, Essence Howell, attest that this documentation has been prepared under the direction and in the presence of Shade Flood, MD Electronically Signed: Charline Bills, ED Scribe 09/23/2017 at 11:26 AM.   Patient ID: Brooks Sailors, male    DOB: 1990-07-04, 27 y.o.   MRN: [id number]  Chief Complaint  Patient presents with  . lost of Appetite    5x days give or take. Nausiation to eat on most days lately    HPI Whitman Meinhardt is a 27 y.o. male who presents to Primary Care at [facility] complaining of decreased appetite and nausea. H/o reflux when seen in 2016. Treated with Protonix at that time. - Pt reports decreased appetite and nausea x 5 days since being off work for vacation. He has also noticed softer/loose stools 2-3 times/day but not quite diarrhea, burning abdominal pain, worsening acid reflux, night sweats, some dysuria with dark urine, unexpected weight loss (reports ~20 lbs over vacation). Denies fever, vomiting, hematuria, blurred vision, rash, recent hospitalizations or visits, recent antibiotics, sick contacts. Takes zantac prn if he eats spicy or fried foods.  Wt Readings from Last 3 Encounters:  09/23/17 262 lb 6.4 oz (119 kg)  08/21/17 272 lb (123.4 kg)  06/06/17 278 lb 3.2 oz (126.2 kg)    Patient Active Problem List   Diagnosis Date Noted  . Migraine without aura and without status migrainosus, not intractable 06/06/2017  . Mild intermittent asthma with acute exacerbation 06/06/2017  . Acute nonintractable headache 11/22/2016   Past Medical History:  Diagnosis Date  . Allergy   . Asthma   . Hypertension    Past Surgical History:  Procedure Laterality Date  . FINGER SURGERY     No Known Allergies Prior to Admission medications   Medication Sig Start Date End Date Taking? Authorizing Provider  Albuterol Sulfate (PROAIR RESPICLICK) 108 (90 Base) MCG/ACT AEPB Inhale 1 Inhaler into the lungs every 4 (four) hours as  needed. Patient taking differently: Inhale 2 puffs into the lungs every 4 (four) hours as needed.  08/08/16  Yes Shade Flood, MD  Budesonide (PULMICORT FLEXHALER) 90 MCG/ACT inhaler Inhale 1-2 puffs into the lungs 2 (two) times daily. 06/06/17  Yes Sagardia, Eilleen Kempf, MD  cetirizine (ZYRTEC) 10 MG tablet Take 1 tablet (10 mg total) by mouth daily. 08/29/16  Yes Wallis Bamberg, PA-C  cyclobenzaprine (FLEXERIL) 5 MG tablet TAKE 1 TABLET BY MOUTH THREE TIMES DAILY AS NEEDED 12/26/16  Yes Wallis Bamberg, PA-C  ondansetron (ZOFRAN-ODT) 4 MG disintegrating tablet Take 1 tablet (4 mg total) by mouth every 8 (eight) hours as needed for nausea. 08/21/17  Yes Anson Fret, MD  ranitidine (ZANTAC) 150 MG tablet Take 1 tablet (150 mg total) by mouth 2 (two) times daily. 08/29/16  Yes Wallis Bamberg, PA-C  rizatriptan (MAXALT-MLT) 10 MG disintegrating tablet Take 1 tablet (10 mg total) by mouth as needed for migraine. May repeat in 2 hours if needed 08/21/17  Yes Anson Fret, MD   Social History   Socioeconomic History  . Marital status: Significant Other    Spouse name: Not on file  . Number of children: 1  . Years of education: Not on file  . Highest education level: Not on file  Occupational History  . Not on file  Social Needs  . Financial resource strain: Not on file  . Food insecurity:    Worry: Not on file    Inability: Not on file  .  Transportation needs:    Medical: Not on file    Non-medical: Not on file  Tobacco Use  . Smoking status: Former Games developer  . Smokeless tobacco: Former Engineer, water and Sexual Activity  . Alcohol use: No    Alcohol/week: 0.0 oz  . Drug use: No  . Sexual activity: Never  Lifestyle  . Physical activity:    Days per week: Not on file    Minutes per session: Not on file  . Stress: Not on file  Relationships  . Social connections:    Talks on phone: Not on file    Gets together: Not on file    Attends religious service: Not on file    Active member of  club or organization: Not on file    Attends meetings of clubs or organizations: Not on file    Relationship status: Not on file  . Intimate partner violence:    Fear of current or ex partner: Not on file    Emotionally abused: Not on file    Physically abused: Not on file    Forced sexual activity: Not on file  Other Topics Concern  . Not on file  Social History Narrative   Lives at home with his significant other   Caffeine: 1-2 cups daily   Review of Systems  Constitutional: Positive for appetite change, diaphoresis and unexpected weight change. Negative for fever.  Eyes: Negative for visual disturbance.  Gastrointestinal: Positive for abdominal pain, diarrhea (mild) and nausea. Negative for vomiting.  Genitourinary: Positive for dysuria (mild). Negative for hematuria.  Skin: Negative for rash.      Objective:   Physical Exam  Constitutional: He is oriented to person, place, and time. He appears well-developed and well-nourished. No distress.  HENT:  Head: Normocephalic and atraumatic.  Eyes: Conjunctivae and EOM are normal.  Neck: Neck supple. No tracheal deviation present.  Cardiovascular: Normal rate.  Pulmonary/Chest: Effort normal. No respiratory distress.  Abdominal: There is tenderness (slight) in the epigastric area and left lower quadrant. There is no CVA tenderness, no tenderness at McBurney's point and negative Murphy's sign.  RUQ nontender. No suprapubic tenderness.  Musculoskeletal: Normal range of motion.  Neurological: He is alert and oriented to person, place, and time.  Skin: Skin is warm and dry.  Psychiatric: He has a normal mood and affect. His behavior is normal.  Nursing note and vitals reviewed.  Vitals:   09/23/17 1101  BP: 134/86  Pulse: 91  Temp: 98.6 F (37 C)  TempSrc: Oral  SpO2: 96%  Weight: 262 lb 6.4 oz (119 kg)  Height: 5\' 11"  (1.803 m)   Results for orders placed or performed in visit on 09/23/17  POCT urinalysis dipstick   Result Value Ref Range   Color, UA yellow yellow   Clarity, UA clear clear   Glucose, UA negative negative mg/dL   Bilirubin, UA negative negative   Ketones, POC UA negative negative mg/dL   Spec Grav, UA 4.098 1.191 - 1.025   Blood, UA negative negative   pH, UA 6.0 5.0 - 8.0   Protein Ur, POC trace (A) negative mg/dL   Urobilinogen, UA 1.0 0.2 or 1.0 E.U./dL   Nitrite, UA Negative Negative   Leukocytes, UA Negative Negative  POCT CBC  Result Value Ref Range   WBC 8.6 4.6 - 10.2 K/uL   Lymph, poc 2.1 0.6 - 3.4   POC LYMPH PERCENT 24.6 10 - 50 %L   MID (cbc) 0.4 0 - 0.9  POC MID % 4.8 0 - 12 %M   POC Granulocyte 6.1 2 - 6.9   Granulocyte percent 70.6 37 - 80 %G   RBC 5.54 4.69 - 6.13 M/uL   Hemoglobin 15.6 14.1 - 18.1 g/dL   HCT, POC 69.6 29.5 - 53.7 %   MCV 85.5 80 - 97 fL   MCH, POC 28.3 27 - 31.2 pg   MCHC 33.0 31.8 - 35.4 g/dL   RDW, POC 28.4 %   Platelet Count, POC 229 142 - 424 K/uL   MPV 8.9 0 - 99.8 fL  POCT glucose (manual entry)  Result Value Ref Range   POC Glucose 73 70 - 99 mg/dl      Assessment & Plan:    Kelin Borum is a 26 y.o. male Nausea without vomiting - Plan: POCT glucose (manual entry), Comprehensive metabolic panel, Lipase, ondansetron (ZOFRAN ODT) 4 MG disintegrating tablet  Abdominal pain, LLQ - Plan: POCT urinalysis dipstick, POCT CBC, POCT glucose (manual entry), Comprehensive metabolic panel, Lipase  Loss of weight - Plan: POCT glucose (manual entry)  Noninfectious gastroenteritis, unspecified type Afebrile, reassuring CBC, urinalysis.  Suspected viral gastroenteritis versus foodborne illness.  Appears to be overall hydrated.    -Restart Zantac twice daily for possible GERD component and avoid trigger foods given on handout.  - Check lipase to rule out pancreatic cause as well as CMP to look at liver/gallbladder but unlikely.  -Symptomatic care discussed with fluids and bland foods initially,, Zofran prescribed if needed for nausea  with potential side effects discussed and RTC precautions given.  Meds ordered this encounter  Medications  . ondansetron (ZOFRAN ODT) 4 MG disintegrating tablet    Sig: Take 1 tablet (4 mg total) by mouth every 8 (eight) hours as needed for nausea or vomiting.    Dispense:  10 tablet    Refill:  0   Patient Instructions    Restart zantac twice per day for now and avoid trigger foods below for heartburn Fluids, and bland food for now.  Fluids are most important.   Zofran if needed for nausea.   Blood sugar, infection fighting cells on blood counts, and urine tests were overall reassuring.  I will send off the pancreas test, liver test and electrolytes as we discussed.  I suspect you may have a viral gastroenteritis that should start to improve in the next few days.  Follow-up with me in the next 1 week if not improving, sooner if worse.   Return to the clinic or go to the nearest emergency room if any of your symptoms worsen or new symptoms occur.  Food Choices for Gastroesophageal Reflux Disease, Adult When you have gastroesophageal reflux disease (GERD), the foods you eat and your eating habits are very important. Choosing the right foods can help ease your discomfort. What guidelines do I need to follow?  Choose fruits, vegetables, whole grains, and low-fat dairy products.  Choose low-fat meat, fish, and poultry.  Limit fats such as oils, salad dressings, butter, nuts, and avocado.  Keep a food diary. This helps you identify foods that cause symptoms.  Avoid foods that cause symptoms. These may be different for everyone.  Eat small meals often instead of 3 large meals a day.  Eat your meals slowly, in a place where you are relaxed.  Limit fried foods.  Cook foods using methods other than frying.  Avoid drinking alcohol.  Avoid drinking large amounts of liquids with your meals.  Avoid bending over or lying  down until 2-3 hours after eating. What foods are not  recommended? These are some foods and drinks that may make your symptoms worse: Vegetables Tomatoes. Tomato juice. Tomato and spaghetti sauce. Chili peppers. Onion and garlic. Horseradish. Fruits Oranges, grapefruit, and lemon (fruit and juice). Meats High-fat meats, fish, and poultry. This includes hot dogs, ribs, ham, sausage, salami, and bacon. Dairy Whole milk and chocolate milk. Sour cream. Cream. Butter. Ice cream. Cream cheese. Drinks Coffee and tea. Bubbly (carbonated) drinks or energy drinks. Condiments Hot sauce. Barbecue sauce. Sweets/Desserts Chocolate and cocoa. Donuts. Peppermint and spearmint. Fats and Oils High-fat foods. This includes Jamaica[place] fries and potato chips. Other Vinegar. Strong spices. This includes black pepper, white pepper, red pepper, cayenne, curry powder, cloves, ginger, and chili powder. The items listed above may not be a complete list of foods and drinks to avoid. Contact your dietitian for more information. This information is not intended to replace advice given to you by your health care provider. Make sure you discuss any questions you have with your health care provider. Document Released: 09/03/2011 Document Revised: 08/10/2015 Document Reviewed: 01/06/2013 Elsevier Interactive Patient Education  2017 [employer]    IF you received an x-ray today, you will receive an invoice from [facility]. Please contact [facility] at [phone] with questions or concerns regarding your invoice.   IF you received labwork today, you will receive an invoice from [facility]. Please contact [facility] at [phone] with questions or concerns regarding your invoice.   Our billing staff will not be able to assist you with questions regarding bills from these companies.  You will be contacted with the lab results as soon as they are available. The fastest way to get your results is to activate your My Chart account. Instructions are located  on the last page of this paperwork. If you have not heard from us regarding the results in 2 weeks, please contact this office.       I personally performed the services described in this documentation, which was scribed in my presence. The recorded information has been reviewed and considered for accuracy and completeness, addended by me as needed, and agree with information above.  Signed,   Meredith StaggersJeffrey Floyed Masoud, MD Primary Care at [facility].  09/24/17 3:15 PM

## 2017-09-24 LAB — LIPASE: Lipase: 38 U/L (ref 13–78)

## 2017-09-24 LAB — COMPREHENSIVE METABOLIC PANEL
A/G RATIO: 1.9 (ref 1.2–2.2)
ALT: 25 IU/L (ref 0–44)
AST: 23 IU/L (ref 0–40)
Albumin: 4.9 g/dL (ref 3.5–5.5)
Alkaline Phosphatase: 64 IU/L (ref 39–117)
BUN/Creatinine Ratio: 10 (ref 9–20)
BUN: 11 mg/dL (ref 6–20)
Bilirubin Total: 0.5 mg/dL (ref 0.0–1.2)
CALCIUM: 9.5 mg/dL (ref 8.7–10.2)
CO2: 20 mmol/L (ref 20–29)
Chloride: 102 mmol/L (ref 96–106)
Creatinine, Ser: 1.1 mg/dL (ref 0.76–1.27)
GFR calc Af Amer: 104 mL/min/{1.73_m2} (ref >59)
GFR, EST NON AFRICAN AMERICAN: 90 mL/min/{1.73_m2} (ref >59)
GLUCOSE: 92 mg/dL (ref 65–99)
Globulin, Total: 2.6 g/dL (ref 1.5–4.5)
POTASSIUM: 3.9 mmol/L (ref 3.5–5.2)
Sodium: 141 mmol/L (ref 134–144)
TOTAL PROTEIN: 7.5 g/dL (ref 6.0–8.5)

## 2017-11-04 ENCOUNTER — Encounter: Payer: Self-pay | Admitting: Neurology

## 2017-11-04 ENCOUNTER — Ambulatory Visit (INDEPENDENT_AMBULATORY_CARE_PROVIDER_SITE_OTHER): Payer: Managed Care, Other (non HMO) | Admitting: Neurology

## 2017-11-04 VITALS — BP 133/87 | HR 70 | Ht 71.0 in | Wt 258.0 lb

## 2017-11-04 DIAGNOSIS — E669 Obesity, unspecified: Secondary | ICD-10-CM | POA: Diagnosis not present

## 2017-11-04 DIAGNOSIS — G4719 Other hypersomnia: Secondary | ICD-10-CM

## 2017-11-04 DIAGNOSIS — J4520 Mild intermittent asthma, uncomplicated: Secondary | ICD-10-CM

## 2017-11-04 DIAGNOSIS — R0683 Snoring: Secondary | ICD-10-CM

## 2017-11-04 DIAGNOSIS — G4761 Periodic limb movement disorder: Secondary | ICD-10-CM

## 2017-11-04 DIAGNOSIS — R0681 Apnea, not elsewhere classified: Secondary | ICD-10-CM

## 2017-11-04 DIAGNOSIS — G2581 Restless legs syndrome: Secondary | ICD-10-CM

## 2017-11-04 DIAGNOSIS — R51 Headache: Secondary | ICD-10-CM | POA: Diagnosis not present

## 2017-11-04 DIAGNOSIS — R519 Headache, unspecified: Secondary | ICD-10-CM

## 2017-11-04 NOTE — Patient Instructions (Addendum)

## 2017-11-04 NOTE — Progress Notes (Signed)
Subjective:    Patient ID: Antonio Kelley is a 27 y.o. male.  HPI     Huston Foley, MD, PhD [facility] [address], [address] [address] [place], [place] [address]  Dear Desma Maxim,   I saw your patient, Antonio Kelley, upon your kind request in my clinic today for initial consultation of his sleep disorder, in particular, concern for underlying obstructive sleep apnea. The patient is accompanied by his wife and child today. As you know, Antonio Kelley is a 27 year old right-handed gentleman with an underlying medical history of allergies, asthma, hypertension and obesity, who reports snoring and excessive daytime somnolence. His wife has noted the occasional positive in his breathing while he is asleep. He is a restless sleeper. He does not typically wake up rested. I reviewed your office note from 08/21/2017. His Epworth sleepiness score is 15 out of 24, fatigue score is 50 out of 63. He lives at home with his wife and child. He works for a Facilities manager. He quit smoking some 8 years ago, does not utilize alcohol on a regular basis, drinks caffeine occasionally in the form of soda or coffee. He tries to keep a set bedtime and rise time routine. His bedtime is generally between 8 and 8:30 PM, rise time around 4 AM. He works as a Chiropractor. His son sleeps in a crib next to them. They have a dog in the bedroom, typically not on their bed. He does not rely on watching TV in the bedroom but there is a TV on sometimes. He endorses restless leg symptoms which can fluctuate, he believes that he has a family history of restless leg syndrome. He is not aware of any family history of OSA. He does move his legs during sleep and also rocks himself to sleep typically. He does not have night to night nocturia but has had frequent morning headaches. He has not yet tried Maxalt, he had some leftover sumatriptan. His weight tends to fluctuate quite a bit. He was originally diagnosed with  exercise-induced asthma when he was a teenager, however, he has had intermittent asthma symptoms even in the absence of physical activity.  His Past Medical History Is Significant For: Past Medical History:  Diagnosis Date  . Allergy   . Asthma   . Hypertension     His Past Surgical History Is Significant For: Past Surgical History:  Procedure Laterality Date  . FINGER SURGERY      His Family History Is Significant For: Family History  Problem Relation Age of Onset  . Cancer Mother   . Migraines Mother   . Cancer Maternal Grandmother     His Social History Is Significant For: Social History   Socioeconomic History  . Marital status: Significant Other    Spouse name: Not on file  . Number of children: 1  . Years of education: Not on file  . Highest education level: Not on file  Occupational History  . Not on file  Social Needs  . Financial resource strain: Not on file  . Food insecurity:    Worry: Not on file    Inability: Not on file  . Transportation needs:    Medical: Not on file    Non-medical: Not on file  Tobacco Use  . Smoking status: Former Games developer  . Smokeless tobacco: Former Engineer, water and Sexual Activity  . Alcohol use: No    Alcohol/week: 0.0 standard drinks  . Drug use: No  . Sexual activity:  Never  Lifestyle  . Physical activity:    Days per week: Not on file    Minutes per session: Not on file  . Stress: Not on file  Relationships  . Social connections:    Talks on phone: Not on file    Gets together: Not on file    Attends religious service: Not on file    Active member of club or organization: Not on file    Attends meetings of clubs or organizations: Not on file    Relationship status: Not on file  Other Topics Concern  . Not on file  Social History Narrative   Lives at home with his significant other   Caffeine: 1-2 cups daily    His Allergies Are:  No Known Allergies:   His Current Medications Are:  Outpatient Encounter  Medications as of 11/04/2017  Medication Sig  . Albuterol Sulfate (PROAIR RESPICLICK) 108 (90 Base) MCG/ACT AEPB Inhale 1 Inhaler into the lungs every 4 (four) hours as needed. (Patient taking differently: Inhale 2 puffs into the lungs every 4 (four) hours as needed. )  . Budesonide (PULMICORT FLEXHALER) 90 MCG/ACT inhaler Inhale 1-2 puffs into the lungs 2 (two) times daily.  . cetirizine (ZYRTEC) 10 MG tablet Take 1 tablet (10 mg total) by mouth daily.  . cyclobenzaprine (FLEXERIL) 5 MG tablet TAKE 1 TABLET BY MOUTH THREE TIMES DAILY AS NEEDED  . ondansetron (ZOFRAN ODT) 4 MG disintegrating tablet Take 1 tablet (4 mg total) by mouth every 8 (eight) hours as needed for nausea or vomiting.  . ranitidine (ZANTAC) 150 MG tablet Take 1 tablet (150 mg total) by mouth 2 (two) times daily.  . rizatriptan (MAXALT-MLT) 10 MG disintegrating tablet Take 1 tablet (10 mg total) by mouth as needed for migraine. May repeat in 2 hours if needed   No facility-administered encounter medications on file as of 11/04/2017.   :  Review of Systems:  Out of a complete 14 point review of systems, all are reviewed and negative with the exception of these symptoms as listed below: Review of Systems  Neurological:       Pt presents today to discuss his sleep. Pt has never had a sleep study but does endorse snoring.  Epworth Sleepiness Scale 0= would never doze 1= slight chance of dozing 2= moderate chance of dozing 3= high chance of dozing  Sitting and reading: 3 Watching TV: 3 Sitting inactive in a public place (ex. Theater or meeting): 2 As a passenger in a car for an hour without a break: 3 Lying down to rest in the afternoon: 3 Sitting and talking to someone: 0 Sitting quietly after lunch (no alcohol): 1 In a car, while stopped in traffic: 0 Total: 15     Objective:  Neurological Exam  Physical Exam Physical Examination:   Vitals:   11/04/17 1309  BP: 133/87  Pulse: 70   General Examination:  The patient is a very pleasant 27 y.o. male in no acute distress. He appears well-developed and well-nourished and well groomed.   HEENT: Normocephalic, atraumatic, pupils are equal, round and reactive to light and accommodation. Extraocular tracking is good without limitation to gaze excursion or nystagmus noted. Normal smooth pursuit is noted. Hearing is grossly intact. Face is symmetric with normal facial animation and normal facial sensation. Speech is clear with no dysarthria noted. There is no hypophonia. There is no lip, neck/head, jaw or voice tremor. Neck is supple with full range of passive and active motion.  There are no carotid bruits on auscultation. Oropharynx exam reveals: mild mouth dryness, adequate dental hygiene and moderate airway crowding, due smaller airway entry, large uvula. Tonsils in place of 1+ bilaterally. He has a mild overbite. Mallampati is class II. Neck circumference is 18 and three-quarter inches.  Chest: Clear to auscultation without wheezing, rhonchi or crackles noted.  Heart: S1+S2+0, regular and normal without murmurs, rubs or gallops noted.   Abdomen: Soft, non-tender and non-distended with normal bowel sounds appreciated on auscultation.  Extremities: There is no pitting edema in the distal lower extremities bilaterally.   Skin: Warm and dry without trophic changes noted.  Musculoskeletal: exam reveals no obvious joint deformities, tenderness or joint swelling or erythema.   Neurologically:  Mental status: The patient is awake, alert and oriented in all 4 spheres. His immediate and remote memory, attention, language skills and fund of knowledge are appropriate. There is no evidence of aphasia, agnosia, apraxia or anomia. Speech is clear with normal prosody and enunciation. Thought process is linear. Mood is normal and affect is normal.  Cranial nerves II - XII are as described above under HEENT exam. In addition: shoulder shrug is normal with equal shoulder  height noted. Motor exam: Normal bulk, strength and tone is noted. There is no drift, tremor or rebound. Romberg is negative, after initial sway. Fine motor skills and coordination: grossly intact.  Cerebellar testing: No dysmetria or intention tremor. There is no truncal or gait ataxia.  Sensory exam: intact to light touch in the upper and lower extremities.  Gait, station and balance: He stands easily. No veering to one side is noted. No leaning to one side is noted. Posture is age-appropriate and stance is narrow based. Gait shows normal stride length and normal pace. No problems turning are noted. Tandem walk is unremarkable.   Assessment and Plan:  In summary, Brooks SailorsCody Mcgurn is a very pleasant 27 y.o.-year old male with an underlying medical history of allergies, asthma, hypertension and obesity, whose history and physical exam are concerning for obstructive sleep apnea (OSA). He also endorses intermittent restless leg symptoms and his wife has noted leg movements while he is asleep. He suspects that he has a family history of restless leg syndrome as well. I had a long chat with the patient and his wife about my findings and the diagnosis of OSA, its prognosis and treatment options. We talked about medical treatments, surgical interventions and non-pharmacological approaches. I explained in particular the risks and ramifications of untreated moderate to severe OSA, especially with respect to developing cardiovascular disease down the Road, including congestive heart failure, difficult to treat hypertension, cardiac arrhythmias, or stroke. Even type 2 diabetes has, in part, been linked to untreated OSA. Symptoms of untreated OSA include daytime sleepiness, memory problems, mood irritability and mood disorder such as depression and anxiety, lack of energy, as well as recurrent headaches, especially morning headaches. We talked about trying to maintain a healthy lifestyle in general, as well as the  importance of weight control. I encouraged the patient to eat healthy, exercise daily and keep well hydrated, to keep a scheduled bedtime and wake time routine, to not skip any meals and eat healthy snacks in between meals. I advised the patient not to drive when feeling sleepy. I recommended the following at this time: sleep study with potential positive airway pressure titration. (We will score hypopneas at 4%).   I explained the sleep test procedure to the patient and also outlined possible surgical and non-surgical treatment  options of OSA, including the use of a custom-made dental device (which would require a referral to a specialist dentist or oral surgeon), upper airway surgical options, such as pillar implants, radiofrequency surgery, tongue base surgery, and UPPP (which would involve a referral to an ENT surgeon). Rarely, jaw surgery such as mandibular advancement may be considered.  I also explained the CPAP treatment option to the patient, who indicated that he would be willing to try CPAP if the need arises. I explained the importance of being compliant with PAP treatment, not only for insurance purposes but primarily to improve His symptoms, and for the patient's long term health benefit, including to reduce His cardiovascular risks. I answered all their questions today and the patient and his wife were in agreement. I plan to see him back after the sleep study is completed and encouraged them to call with any interim questions, concerns, problems or updates.   Thank you very much for allowing me to participate in the care of this nice patient. If I can be of any further assistance to you please do not hesitate to talk to me.  Sincerely,   Huston FoleySaima Irvin Lizama, MD, PhD

## 2017-11-11 ENCOUNTER — Telehealth: Payer: Self-pay

## 2017-11-11 DIAGNOSIS — G4719 Other hypersomnia: Secondary | ICD-10-CM

## 2017-11-11 NOTE — Addendum Note (Signed)
Addended by: Geronimo RunningINKINS, Kassius Battiste A on: 11/11/2017 11:18 AM   Modules accepted: Orders

## 2017-11-11 NOTE — Telephone Encounter (Signed)
VO for HST from Dr. Athar received. HST order placed.  

## 2017-11-11 NOTE — Telephone Encounter (Signed)
Insurance has denied in lab sleep study request. Do you want to order HST? 

## 2017-12-15 ENCOUNTER — Encounter: Payer: Managed Care, Other (non HMO) | Admitting: Neurology

## 2018-01-07 ENCOUNTER — Telehealth: Payer: Self-pay | Admitting: Neurology

## 2018-01-07 NOTE — Telephone Encounter (Signed)
We have attempted to call the patient 2 times to schedule sleep study. Patient has been unavailable at the phone numbers we have on file and has not returned our calls. At this point we will send a letter asking pt to please contact the sleep lab to schedule their sleep study. If patient calls back we will schedule them for their sleep study. ° °

## 2018-03-31 ENCOUNTER — Other Ambulatory Visit: Payer: Self-pay

## 2018-03-31 ENCOUNTER — Encounter: Payer: Self-pay | Admitting: Family Medicine

## 2018-03-31 ENCOUNTER — Ambulatory Visit: Payer: Managed Care, Other (non HMO) | Admitting: Family Medicine

## 2018-03-31 VITALS — BP 124/82 | HR 79 | Temp 98.4°F | Resp 20 | Ht 71.54 in | Wt 255.8 lb

## 2018-03-31 DIAGNOSIS — R1013 Epigastric pain: Secondary | ICD-10-CM | POA: Diagnosis not present

## 2018-03-31 DIAGNOSIS — K219 Gastro-esophageal reflux disease without esophagitis: Secondary | ICD-10-CM | POA: Diagnosis not present

## 2018-03-31 LAB — IFOBT (OCCULT BLOOD): IFOBT: POSITIVE

## 2018-03-31 MED ORDER — OMEPRAZOLE 20 MG PO CPDR: 20.0000 mg | DELAYED_RELEASE_CAPSULE | Freq: Every day | ORAL | 1 refills | Status: DC

## 2018-03-31 NOTE — Progress Notes (Signed)
Subjective:    Patient ID: Antonio Kelley, male    DOB: Aug 04, 1990, 28 y.o.   MRN: [id number]  HPI Antonio Kelley is a 28 y.o. male Presents today for: Chief Complaint  Patient presents with  . Heartburn    X 2-3 weeks-   Here with possible heartburn.  History of GERD in the past.  Seen in July 2019 with gastroenteritis.  Feeling of stomach on fire yesterday morning, spaghetti with red sauce night prior. Stomach better today, but still some burning.  Longstanding heartburn.  Had been on meds in past, no recent meds.  No recent increased stressors.  Small amt of blood on tissue yesterday or may have been tomato, none prior. no pain with wiping.   No diarrhea/constipation recently.   Tx: none.  Rare caffiene, no alcohol. Vaping tobacco only, but no other tobacco.  No recent change in diet.  More restaurant food recently over the holidays.   Aunt may have had gastorintestinal malignancy, unknown specifics.   Patient Active Problem List   Diagnosis Date Noted  . Migraine without aura and without status migrainosus, not intractable 06/06/2017  . Mild intermittent asthma with acute exacerbation 06/06/2017  . Acute nonintractable headache 11/22/2016   Past Medical History:  Diagnosis Date  . Allergy   . Asthma   . Hypertension    Past Surgical History:  Procedure Laterality Date  . FINGER SURGERY     No Known Allergies Prior to Admission medications   Medication Sig Start Date End Date Taking? Authorizing Provider  Albuterol Sulfate (PROAIR RESPICLICK) 108 (90 Base) MCG/ACT AEPB Inhale 1 Inhaler into the lungs every 4 (four) hours as needed. Patient taking differently: Inhale 2 puffs into the lungs every 4 (four) hours as needed.  08/08/16  Yes Shade Flood, MD  Budesonide (PULMICORT FLEXHALER) 90 MCG/ACT inhaler Inhale 1-2 puffs into the lungs 2 (two) times daily. 06/06/17  Yes Sagardia, Eilleen Kempf, MD  cetirizine (ZYRTEC) 10 MG tablet Take 1 tablet (10 mg total) by  mouth daily. 08/29/16  Yes Wallis Bamberg, PA-C  cyclobenzaprine (FLEXERIL) 5 MG tablet TAKE 1 TABLET BY MOUTH THREE TIMES DAILY AS NEEDED 12/26/16  Yes Wallis Bamberg, PA-C  ondansetron (ZOFRAN ODT) 4 MG disintegrating tablet Take 1 tablet (4 mg total) by mouth every 8 (eight) hours as needed for nausea or vomiting. Patient not taking: Reported on 03/31/2018 09/23/17   Shade Flood, MD  ranitidine (ZANTAC) 150 MG tablet Take 1 tablet (150 mg total) by mouth 2 (two) times daily. Patient not taking: Reported on 03/31/2018 08/29/16   Wallis Bamberg, PA-C  rizatriptan (MAXALT-MLT) 10 MG disintegrating tablet Take 1 tablet (10 mg total) by mouth as needed for migraine. May repeat in 2 hours if needed Patient not taking: Reported on 03/31/2018 08/21/17   Anson Fret, MD   Social History   Socioeconomic History  . Marital status: Significant Other    Spouse name: Not on file  . Number of children: 1  . Years of education: Not on file  . Highest education level: Not on file  Occupational History  . Not on file  Social Needs  . Financial resource strain: Not on file  . Food insecurity:    Worry: Not on file    Inability: Not on file  . Transportation needs:    Medical: Not on file    Non-medical: Not on file  Tobacco Use  . Smoking status: Former Smoker    Types: Cigarettes  .  Smokeless tobacco: Former Engineer, waterUser  Substance and Sexual Activity  . Alcohol use: Yes    Alcohol/week: 0.0 standard drinks    Comment: occ 3 times a year  . Drug use: No  . Sexual activity: Yes  Lifestyle  . Physical activity:    Days per week: Not on file    Minutes per session: Not on file  . Stress: Not on file  Relationships  . Social connections:    Talks on phone: Not on file    Gets together: Not on file    Attends religious service: Not on file    Active member of club or organization: Not on file    Attends meetings of clubs or organizations: Not on file    Relationship status: Not on file  . Intimate  partner violence:    Fear of current or ex partner: Not on file    Emotionally abused: Not on file    Physically abused: Not on file    Forced sexual activity: Not on file  Other Topics Concern  . Not on file  Social History Narrative   Lives at home with his significant other   Caffeine: 1-2 cups daily    Review of Systems  Constitutional: Negative for chills, diaphoresis, fever and unexpected weight change.  Gastrointestinal: Positive for abdominal pain and anal bleeding. Negative for blood in stool, constipation, diarrhea, nausea and vomiting.      Objective:   Physical Exam Vitals signs reviewed.  Constitutional:      Appearance: He is well-developed.  HENT:     Head: Normocephalic and atraumatic.  Eyes:     Pupils: Pupils are equal, round, and reactive to light.  Neck:     Vascular: No carotid bruit or JVD.  Cardiovascular:     Rate and Rhythm: Normal rate and regular rhythm.     Heart sounds: Normal heart sounds. No murmur.  Pulmonary:     Effort: Pulmonary effort is normal.     Breath sounds: Normal breath sounds. No rales.  Abdominal:     Tenderness: There is abdominal tenderness (epigastric).  Skin:    General: Skin is warm and dry.  Neurological:     Mental Status: He is alert and oriented to person, place, and time.    Vitals:   03/31/18 1646  BP: 124/82  Pulse: 79  Resp: 20  Temp: 98.4 F (36.9 C)  SpO2: 96%   Results for orders placed or performed in visit on 03/31/18  IFOBT POC (occult bld, rslt in office)  Result Value Ref Range   IFOBT Positive       Assessment & Plan:    Antonio Kelley is a 28 y.o. male Abdominal pain, epigastric - Plan: Comprehensive metabolic panel, Lipase, IFOBT POC (occult bld, rslt in office), H. pylori breath test, omeprazole (PRILOSEC) 20 MG capsule, IFOBT POC (occult bld, rslt in office), Ambulatory referral to Gastroenterology  Gastroesophageal reflux disease, esophagitis presence not specified - Plan: omeprazole  (PRILOSEC) 20 MG capsule, Ambulatory referral to Gastroenterology Persistent heartburn/GERD symptoms, now with slight epigastric discomfort, possible gastritis.  Heme positive stool, but with bright red blood could have been hemorrhoidal.  Reassuring vital signs, nontoxic appearance.  Minimal discomfort at present.  -Restart omeprazole daily.  Avoid GERD triggers, handout given.  -Check CMP, lipase, H. Pylori.  -Refer to gastroenterology for further eval of persistent GERD, ER/RTC precautions if any increased pain or acute worsening symptoms.  Understanding expressed   Meds ordered this encounter  Medications  . omeprazole (PRILOSEC) 20 MG capsule    Sig: Take 1 capsule (20 mg total) by mouth daily.    Dispense:  30 capsule    Refill:  1   Patient Instructions    Start omeprazole once per day.  Avoid spicy food, caffeine, other triggers for heartburn below.  I will check some lab work based on the symptoms today, but if any worse abdominal pain, dark tar colored stools or bleeding be seen right away the emergency room.   Abdominal Pain, Adult Abdominal pain can be caused by many things. Often, abdominal pain is not serious and it gets better with no treatment or by being treated at home. However, sometimes abdominal pain is serious. Your health care provider will do a medical history and a physical exam to try to determine the cause of your abdominal pain. Follow these instructions at home:  Take over-the-counter and prescription medicines only as told by your health care provider. Do not take a laxative unless told by your health care provider.  Drink enough fluid to keep your urine clear or pale yellow.  Watch your condition for any changes.  Keep all follow-up visits as told by your health care provider. This is important. Contact a health care provider if:  Your abdominal pain changes or gets worse.  You are not hungry or you lose weight without trying.  You are constipated  or have diarrhea for more than 2-3 days.  You have pain when you urinate or have a bowel movement.  Your abdominal pain wakes you up at night.  Your pain gets worse with meals, after eating, or with certain foods.  You are throwing up and cannot keep anything down.  You have a fever. Get help right away if:  Your pain does not go away as soon as your health care provider told you to expect.  You cannot stop throwing up.  Your pain is only in areas of the abdomen, such as the right side or the left lower portion of the abdomen.  You have bloody or black stools, or stools that look like tar.  You have severe pain, cramping, or bloating in your abdomen.  You have signs of dehydration, such as: ? Dark urine, very little urine, or no urine. ? Cracked lips. ? Dry mouth. ? Sunken eyes. ? Sleepiness. ? Weakness. This information is not intended to replace advice given to you by your health care provider. Make sure you discuss any questions you have with your health care provider. Document Released: 12/12/2004 Document Revised: 09/22/2015 Document Reviewed: 08/16/2015 Elsevier Interactive Patient Education  2019 [employer]  Food Choices for Gastroesophageal Reflux Disease, Adult When you have gastroesophageal reflux disease (GERD), the foods you eat and your eating habits are very important. Choosing the right foods can help ease your discomfort. Think about working with a nutrition specialist (dietitian) to help you make good choices. What are tips for following this plan?  Meals  Choose healthy foods that are low in fat, such as fruits, vegetables, whole grains, low-fat dairy products, and lean meat, fish, and poultry.  Eat small meals often instead of 3 large meals a day. Eat your meals slowly, and in a place where you are relaxed. Avoid bending over or lying down until 2-3 hours after eating.  Avoid eating meals 2-3 hours before bed.  Avoid drinking a lot of liquid with  meals.  Cook foods using methods other than frying. Bake, grill, or broil food  instead.  Avoid or limit: ? Chocolate. ? Peppermint or spearmint. ? Alcohol. ? Pepper. ? Black and decaffeinated coffee. ? Black and decaffeinated tea. ? Bubbly (carbonated) soft drinks. ? Caffeinated energy drinks and soft drinks.  Limit high-fat foods such as: ? Fatty meat or fried foods. ? Whole milk, cream, butter, or ice cream. ? Nuts and nut butters. ? Pastries, donuts, and sweets made with butter or shortening.  Avoid foods that cause symptoms. These foods may be different for everyone. Common foods that cause symptoms include: ? Tomatoes. ? Oranges, lemons, and limes. ? Peppers. ? Spicy food. ? Onions and garlic. ? Vinegar. Lifestyle  Maintain a healthy weight. Ask your doctor what weight is healthy for you. If you need to lose weight, work with your doctor to do so safely.  Exercise for at least 30 minutes for 5 or more days each week, or as told by your doctor.  Wear loose-fitting clothes.  Do not smoke. If you need help quitting, ask your doctor.  Sleep with the head of your bed higher than your feet. Use a wedge under the mattress or blocks under the bed frame to raise the head of the bed. Summary  When you have gastroesophageal reflux disease (GERD), food and lifestyle choices are very important in easing your symptoms.  Eat small meals often instead of 3 large meals a day. Eat your meals slowly, and in a place where you are relaxed.  Limit high-fat foods such as fatty meat or fried foods.  Avoid bending over or lying down until 2-3 hours after eating.  Avoid peppermint and spearmint, caffeine, alcohol, and chocolate. This information is not intended to replace advice given to you by your health care provider. Make sure you discuss any questions you have with your health care provider. Document Released: 09/03/2011 Document Revised: 04/09/2016 Document Reviewed:  04/09/2016 Elsevier Interactive Patient Education  [employer].  If you have lab work done today you will be contacted with your lab results within the next 2 weeks.  If you have not heard from [place] then please contact us. The fastest way to get your results is to register for My Chart.   IF you received an x-ray today, you will receive an invoice from [facility]. Please contact [facility] at [phone] with questions or concerns regarding your invoice.   IF you received labwork today, you will receive an invoice from [facility]. Please contact [facility] at [phone] with questions or concerns regarding your invoice.   Our billing staff will not be able to assist you with questions regarding bills from these companies.  You will be contacted with the lab results as soon as they are available. The fastest way to get your results is to activate your My Chart account. Instructions are located on the last page of this paperwork. If you have not heard from [place] regarding the results in 2 weeks, please contact this office.       Signed,   [name], MD Primary Care at [facility].  04/02/18 9:58 PM

## 2018-03-31 NOTE — Patient Instructions (Addendum)
Start omeprazole once per day.  Avoid spicy food, caffeine, other triggers for heartburn below.  I will check some lab work based on the symptoms today, but if any worse abdominal pain, dark tar colored stools or bleeding be seen right away the emergency room.   Abdominal Pain, Adult Abdominal pain can be caused by many things. Often, abdominal pain is not serious and it gets better with no treatment or by being treated at home. However, sometimes abdominal pain is serious. Your health care provider will do a medical history and a physical exam to try to determine the cause of your abdominal pain. Follow these instructions at home:  Take over-the-counter and prescription medicines only as told by your health care provider. Do not take a laxative unless told by your health care provider.  Drink enough fluid to keep your urine clear or pale yellow.  Watch your condition for any changes.  Keep all follow-up visits as told by your health care provider. This is important. Contact a health care provider if:  Your abdominal pain changes or gets worse.  You are not hungry or you lose weight without trying.  You are constipated or have diarrhea for more than 2-3 days.  You have pain when you urinate or have a bowel movement.  Your abdominal pain wakes you up at night.  Your pain gets worse with meals, after eating, or with certain foods.  You are throwing up and cannot keep anything down.  You have a fever. Get help right away if:  Your pain does not go away as soon as your health care provider told you to expect.  You cannot stop throwing up.  Your pain is only in areas of the abdomen, such as the right side or the left lower portion of the abdomen.  You have bloody or black stools, or stools that look like tar.  You have severe pain, cramping, or bloating in your abdomen.  You have signs of dehydration, such as: ? Dark urine, very little urine, or no urine. ? Cracked  lips. ? Dry mouth. ? Sunken eyes. ? Sleepiness. ? Weakness. This information is not intended to replace advice given to you by your health care provider. Make sure you discuss any questions you have with your health care provider. Document Released: 12/12/2004 Document Revised: 09/22/2015 Document Reviewed: 08/16/2015 Elsevier Interactive Patient Education  2019 [employer].  Food Choices for Gastroesophageal Reflux Disease, Adult When you have gastroesophageal reflux disease (GERD), the foods you eat and your eating habits are very important. Choosing the right foods can help ease your discomfort. Think about working with a nutrition specialist (dietitian) to help you make good choices. What are tips for following this plan?  Meals  Choose healthy foods that are low in fat, such as fruits, vegetables, whole grains, low-fat dairy products, and lean meat, fish, and poultry.  Eat small meals often instead of 3 large meals a day. Eat your meals slowly, and in a place where you are relaxed. Avoid bending over or lying down until 2-3 hours after eating.  Avoid eating meals 2-3 hours before bed.  Avoid drinking a lot of liquid with meals.  Cook foods using methods other than frying. Bake, grill, or broil food instead.  Avoid or limit: ? Chocolate. ? Peppermint or spearmint. ? Alcohol. ? Pepper. ? Black and decaffeinated coffee. ? Black and decaffeinated tea. ? Bubbly (carbonated) soft drinks. ? Caffeinated energy drinks and soft drinks.  Limit high-fat foods such  as: ? Fatty meat or fried foods. ? Whole milk, cream, butter, or ice cream. ? Nuts and nut butters. ? Pastries, donuts, and sweets made with butter or shortening.  Avoid foods that cause symptoms. These foods may be different for everyone. Common foods that cause symptoms include: ? Tomatoes. ? Oranges, lemons, and limes. ? Peppers. ? Spicy food. ? Onions and garlic. ? Vinegar. Lifestyle  Maintain a healthy  weight. Ask your doctor what weight is healthy for you. If you need to lose weight, work with your doctor to do so safely.  Exercise for at least 30 minutes for 5 or more days each week, or as told by your doctor.  Wear loose-fitting clothes.  Do not smoke. If you need help quitting, ask your doctor.  Sleep with the head of your bed higher than your feet. Use a wedge under the mattress or blocks under the bed frame to raise the head of the bed. Summary  When you have gastroesophageal reflux disease (GERD), food and lifestyle choices are very important in easing your symptoms.  Eat small meals often instead of 3 large meals a day. Eat your meals slowly, and in a place where you are relaxed.  Limit high-fat foods such as fatty meat or fried foods.  Avoid bending over or lying down until 2-3 hours after eating.  Avoid peppermint and spearmint, caffeine, alcohol, and chocolate. This information is not intended to replace advice given to you by your health care provider. Make sure you discuss any questions you have with your health care provider. Document Released: 09/03/2011 Document Revised: 04/09/2016 Document Reviewed: 04/09/2016 Elsevier Interactive Patient Education  [employer]2019 [employer] Inc.  If you have lab work done today you will be contacted with your lab results within the next 2 weeks.  If you have not heard from us then please contact us. The fastest way to get your results is to register for My Chart.   IF you received an x-ray today, you will receive an invoice from [facility]. Please contact [facility] at [phone] with questions or concerns regarding your invoice.   IF you received labwork today, you will receive an invoice from [facility]. Please contact [facility] at [phone] with questions or concerns regarding your invoice.   Our billing staff will not be able to assist you with questions regarding bills from these companies.  You will be contacted  with the lab results as soon as they are available. The fastest way to get your results is to activate your My Chart account. Instructions are located on the last page of this paperwork. If you have not heard from us regarding the results in 2 weeks, please contact this office.

## 2018-04-01 LAB — COMPREHENSIVE METABOLIC PANEL
ALBUMIN: 4.8 g/dL (ref 3.5–5.5)
ALK PHOS: 62 IU/L (ref 39–117)
ALT: 29 IU/L (ref 0–44)
AST: 24 IU/L (ref 0–40)
Albumin/Globulin Ratio: 2.1 (ref 1.2–2.2)
BILIRUBIN TOTAL: 0.2 mg/dL (ref 0.0–1.2)
BUN / CREAT RATIO: 12 (ref 9–20)
BUN: 12 mg/dL (ref 6–20)
CHLORIDE: 100 mmol/L (ref 96–106)
CO2: 23 mmol/L (ref 20–29)
Calcium: 9.6 mg/dL (ref 8.7–10.2)
Creatinine, Ser: 0.99 mg/dL (ref 0.76–1.27)
GFR calc Af Amer: 118 mL/min/{1.73_m2} (ref >59)
GFR calc non Af Amer: 102 mL/min/{1.73_m2} (ref >59)
GLUCOSE: 86 mg/dL (ref 65–99)
Globulin, Total: 2.3 g/dL (ref 1.5–4.5)
POTASSIUM: 4 mmol/L (ref 3.5–5.2)
SODIUM: 141 mmol/L (ref 134–144)
Total Protein: 7.1 g/dL (ref 6.0–8.5)

## 2018-04-01 LAB — H. PYLORI BREATH COLLECTION

## 2018-04-01 LAB — H. PYLORI BREATH TEST: H pylori Breath Test: NEGATIVE

## 2018-04-01 LAB — LIPASE: Lipase: 38 U/L (ref 13–78)

## 2018-04-23 ENCOUNTER — Encounter: Payer: Self-pay | Admitting: Physician Assistant

## 2018-04-23 ENCOUNTER — Other Ambulatory Visit: Payer: Self-pay

## 2018-04-23 ENCOUNTER — Ambulatory Visit (INDEPENDENT_AMBULATORY_CARE_PROVIDER_SITE_OTHER): Payer: Managed Care, Other (non HMO)

## 2018-04-23 ENCOUNTER — Ambulatory Visit: Payer: Managed Care, Other (non HMO) | Admitting: Physician Assistant

## 2018-04-23 VITALS — BP 136/79 | HR 78 | Temp 98.6°F | Ht 71.54 in | Wt 251.0 lb

## 2018-04-23 DIAGNOSIS — M25571 Pain in right ankle and joints of right foot: Secondary | ICD-10-CM | POA: Diagnosis not present

## 2018-04-23 MED ORDER — PREDNISONE 10 MG PO TABS: ORAL_TABLET | ORAL | 0 refills | Status: DC

## 2018-04-23 NOTE — Progress Notes (Signed)
Antonio SailorsCody Bearman  MRN: [id number] DOB: Jan 04, 1991  PCP: Shade FloodGreene, Jeffrey R, MD  Subjective:  Pt is a 28 year old male who presents to clinic for right great toe pain x 3 weeks.  Endorses mild off and on swelling. Reduced ROM last week.  Pain has now crept up to his ankle.  Pain is about 6/10. No MOI. No h/o gout.     He has been taking advil.    Review of Systems  Constitutional: Negative for chills and fever.  Musculoskeletal: Positive for arthralgias (right great toe), gait problem and joint swelling (mild).  Skin: Negative for color change.    Patient Active Problem List   Diagnosis Date Noted  . Migraine without aura and without status migrainosus, not intractable 06/06/2017  . Mild intermittent asthma with acute exacerbation 06/06/2017  . Acute nonintractable headache 11/22/2016    Current Outpatient Medications on File Prior to Visit  Medication Sig Dispense Refill  . Albuterol Sulfate (PROAIR RESPICLICK) 108 (90 Base) MCG/ACT AEPB Inhale 1 Inhaler into the lungs every 4 (four) hours as needed. (Patient taking differently: Inhale 2 puffs into the lungs every 4 (four) hours as needed. ) 1 each 0  . cyclobenzaprine (FLEXERIL) 5 MG tablet TAKE 1 TABLET BY MOUTH THREE TIMES DAILY AS NEEDED 90 tablet 1  . omeprazole (PRILOSEC) 20 MG capsule Take 1 capsule (20 mg total) by mouth daily. 30 capsule 1   No current facility-administered medications on file prior to visit.     No Known Allergies   Objective:  BP 136/79 (BP Location: Left Arm, Patient Position: Sitting, Cuff Size: Large)   Pulse 78   Temp 98.6 F (37 C) (Oral)   Ht 5' 11.54" (1.817 m)   Wt 251 lb (113.9 kg)   SpO2 99%   BMI 34.48 kg/m   Physical Exam Vitals signs reviewed.  Constitutional:      Appearance: Normal appearance.  Cardiovascular:     Pulses: Normal pulses.  Musculoskeletal:     Right ankle: He exhibits normal range of motion and no swelling. No tenderness.     Right foot: Normal range of  motion. No tenderness, bony tenderness, swelling or crepitus.  Skin:    General: Skin is cool and dry.     Findings: No bruising or erythema.  Neurological:     Mental Status: He is alert.    Dg Foot Complete Right  Result Date: 04/23/2018 CLINICAL DATA:  Pain of MTP for 3 weeks.  Suspect gout. EXAM: RIGHT FOOT COMPLETE - 3+ VIEW COMPARISON:  None. FINDINGS: Bone mineralization is normal. Bony alignment is normal. No acute or suspicious osseous lesion. No fracture line or displaced fracture fragment. No erosion, periarticular demineralization or other confirming signs of an inflammatory arthritis. Soft tissues about the RIGHT foot are unremarkable. IMPRESSION: Negative. Electronically Signed   By: Bary RichardStan  Maynard M.D.   On: 04/23/2018 14:05    Assessment and Plan :  1. Pain in joint involving right ankle and foot - pt presents c/o nonresolving pain of MTP joint x 3 weeks. HPI is suspicious for gout. X-ray is negative, PE is unremarkable. Uric acid is pending.  Plan to treat for gout with prednisone. RTC if no improvement.  - Uric Acid - DG Foot Complete Right; Future - predniSONE (DELTASONE) 10 MG tablet; Take 40 mg once daily x 4 days, then 30mg  x 3 days, then 20mg  x 3 days, then 10mg  x 3 days.  Dispense: 34 tablet; Refill: 0  Marco CollieWhitney Fronie Holstein, PA-C  Primary Care at [facility] 04/23/2018 1:41 PM  Please note: Portions of this report may have been transcribed using dragon voice recognition software. Every effort was made to ensure accuracy; however, inadvertent computerized transcription errors may be present.

## 2018-04-23 NOTE — Patient Instructions (Addendum)
Your x-ray looks perfect.  This sounds highly suspicious of gout.  Start taking prednisone 40 mg once daily until flare resolution begins, and then taper the dose over the next 7 to 10 days. Come back if you are not improving.   If you have lab work done today you will be contacted with your lab results within the next 2 weeks.  If you have not heard from [place] then please contact us. The fastest way to get your results is to register for My Chart.   Gout  Gout is a condition that causes painful swelling of the joints. Gout is a type of inflammation of the joints (arthritis). This condition is caused by having too much uric acid in the body. Uric acid is a chemical that forms when the body breaks down substances called purines. Purines are important for building body proteins. When the body has too much uric acid, sharp crystals can form and build up inside the joints. This causes pain and swelling. Gout attacks can happen quickly and may be very painful (acute gout). Over time, the attacks can affect more joints and become more frequent (chronic gout). Gout can also cause uric acid to build up under the skin and inside the kidneys. What are the causes? This condition is caused by too much uric acid in your blood. This can happen because:  Your kidneys do not remove enough uric acid from your blood. This is the most common cause.  Your body makes too much uric acid. This can happen with some cancers and cancer treatments. It can also occur if your body is breaking down too many red blood cells (hemolytic anemia).  You eat too many foods that are high in purines. These foods include organ meats and some seafood. Alcohol, especially beer, is also high in purines. A gout attack may be triggered by trauma or stress. What increases the risk? You are more likely to develop this condition if you:  Have a family history of gout.  Are male and middle-aged.  Are male and have gone through  menopause.  Are obese.  Frequently drink alcohol, especially beer.  Are dehydrated.  Lose weight too quickly.  Have an organ transplant.  Have lead poisoning.  Take certain medicines, including aspirin, cyclosporine, diuretics, levodopa, and niacin.  Have kidney disease.  Have a skin condition called psoriasis. What are the signs or symptoms? An attack of acute gout happens quickly. It usually occurs in just one joint. The most common place is the big toe. Attacks often start at night. Other joints that may be affected include joints of the feet, ankle, knee, fingers, wrist, or elbow. Symptoms of this condition may include:  Severe pain.  Warmth.  Swelling.  Stiffness.  Tenderness. The affected joint may be very painful to touch.  Shiny, red, or purple skin.  Chills and fever. Chronic gout may cause symptoms more frequently. More joints may be involved. You may also have white or yellow lumps (tophi) on your hands or feet or in other areas near your joints. How is this diagnosed? This condition is diagnosed based on your symptoms, medical history, and physical exam. You may have tests, such as:  Blood tests to measure uric acid levels.  Removal of joint fluid with a thin needle (aspiration) to look for uric acid crystals.  X-rays to look for joint damage. How is this treated? Treatment for this condition has two phases: treating an acute attack and preventing future attacks. Acute  gout treatment may include medicines to reduce pain and swelling, including:  NSAIDs.  Steroids. These are strong anti-inflammatory medicines that can be taken by mouth (orally) or injected into a joint.  Colchicine. This medicine relieves pain and swelling when it is taken soon after an attack. It can be given by mouth or through an IV. Preventive treatment may include:  Daily use of smaller doses of NSAIDs or colchicine.  Use of a medicine that reduces uric acid levels in your  blood.  Changes to your diet. You may need to see a dietitian about what to eat and drink to prevent gout. Follow these instructions at home: During a gout attack   If directed, put ice on the affected area: ? Put ice in a plastic bag. ? Place a towel between your skin and the bag. ? Leave the ice on for 20 minutes, 2-3 times a day.  Raise (elevate) the affected joint above the level of your heart as often as possible.  Rest the joint as much as possible. If the affected joint is in your leg, you may be given crutches to use.  Follow instructions from your health care provider about eating or drinking restrictions. Avoiding future gout attacks  Follow a low-purine diet as told by your dietitian or health care provider. Avoid foods and drinks that are high in purines, including liver, kidney, anchovies, asparagus, herring, mushrooms, mussels, and beer.  Maintain a healthy weight or lose weight if you are overweight. If you want to lose weight, talk with your health care provider. It is important that you do not lose weight too quickly.  Start or maintain an exercise program as told by your health care provider. Eating and drinking  Drink enough fluids to keep your urine pale yellow.  If you drink alcohol: ? Limit how much you use to:  0-1 drink a day for women.  0-2 drinks a day for men. ? Be aware of how much alcohol is in your drink. In the [place] one drink equals one 12 oz bottle of beer (355 mL) one 5 oz glass of wine (148 mL), or one 1 oz glass of hard liquor (44 mL). General instructions  Take over-the-counter and prescription medicines only as told by your health care provider.  Do not drive or use heavy machinery while taking prescription pain medicine.  Return to your normal activities as told by your health care provider. Ask your health care provider what activities are safe for you.  Keep all follow-up visits as told by your health care provider. This is  important. Contact a health care provider if you have:  Another gout attack.  Continuing symptoms of a gout attack after 10 days of treatment.  Side effects from your medicines.  Chills or a fever.  Burning pain when you urinate.  Pain in your lower back or belly. Get help right away if you:  Have severe or uncontrolled pain.  Cannot urinate. Summary  Gout is painful swelling of the joints caused by inflammation.  The most common site of pain is the big toe, but it can affect other joints in the body.  Medicines and dietary changes can help to prevent and treat gout attacks. This information is not intended to replace advice given to you by your health care provider. Make sure you discuss any questions you have with your health care provider. Document Released: 03/01/2000 Document Revised: 09/24/2017 Document Reviewed: 09/24/2017 Elsevier Interactive Patient Education  2019 [employer].  IF you received an x-ray today, you will receive an invoice from [facility]. Please contact [facility] at [phone] with questions or concerns regarding your invoice.   IF you received labwork today, you will receive an invoice from [facility]. Please contact [facility] at [phone] with questions or concerns regarding your invoice.   Our billing staff will not be able to assist you with questions regarding bills from these companies.  You will be contacted with the lab results as soon as they are available. The fastest way to get your results is to activate your My Chart account. Instructions are located on the last page of this paperwork. If you have not heard from us regarding the results in 2 weeks, please contact this office.     

## 2018-04-24 LAB — URIC ACID: Uric Acid: 5.6 mg/dL (ref 3.7–8.6)

## 2018-04-28 ENCOUNTER — Encounter: Payer: Self-pay | Admitting: Physician Assistant

## 2018-05-20 IMAGING — US US CHEST/MEDIASTINUM
1 series · 14 of 15 positions shown · non-contrast
Comparison: None.

CLINICAL DATA: Palpable abnormality to the left of the xiphoid
process

EXAM:
CHEST ULTRASOUND

[Series 1: us chest/mediastinum · 0.06mm/px · 15 acquisitions, 14 frames shown]
[im 1/15]
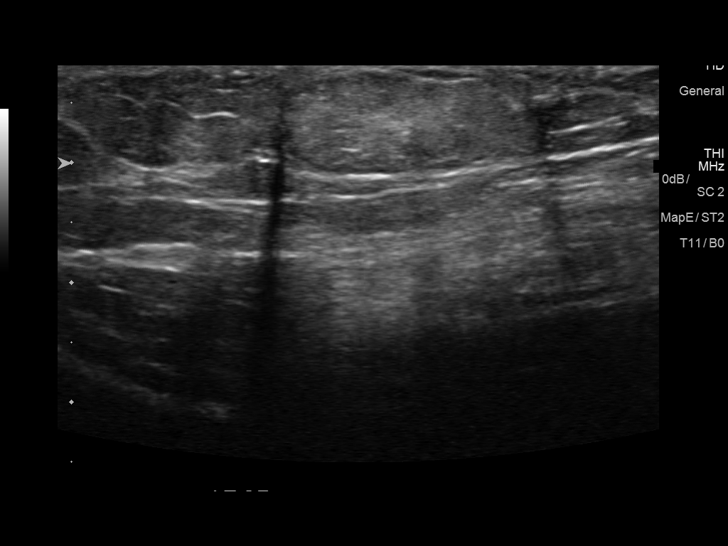
[im 2/15]
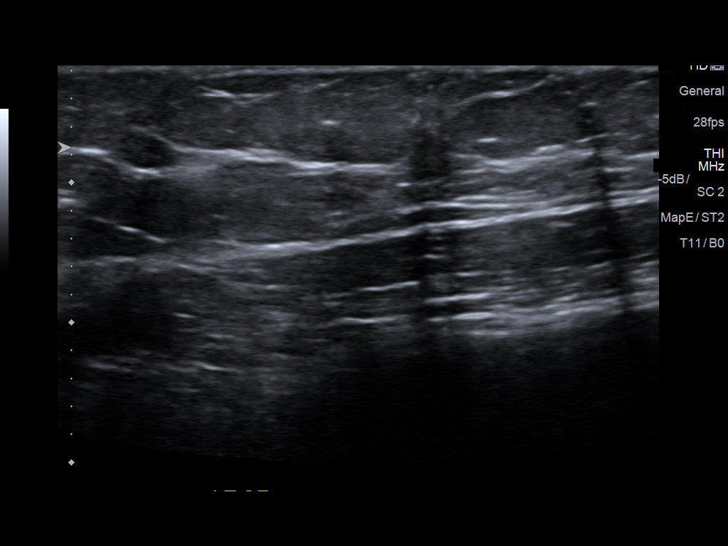
[im 3/15]
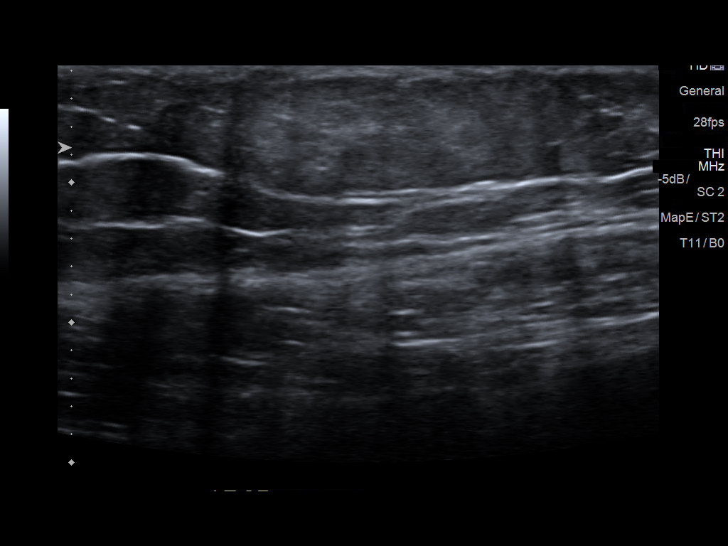
[im 4/15]
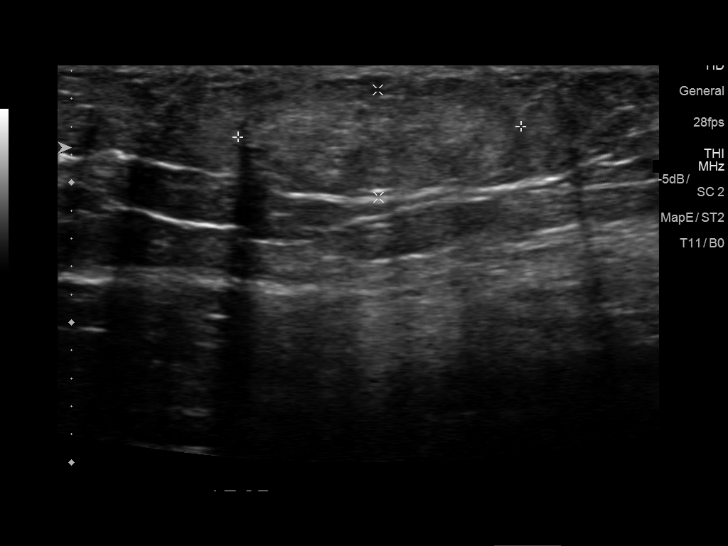
[im 5/15]
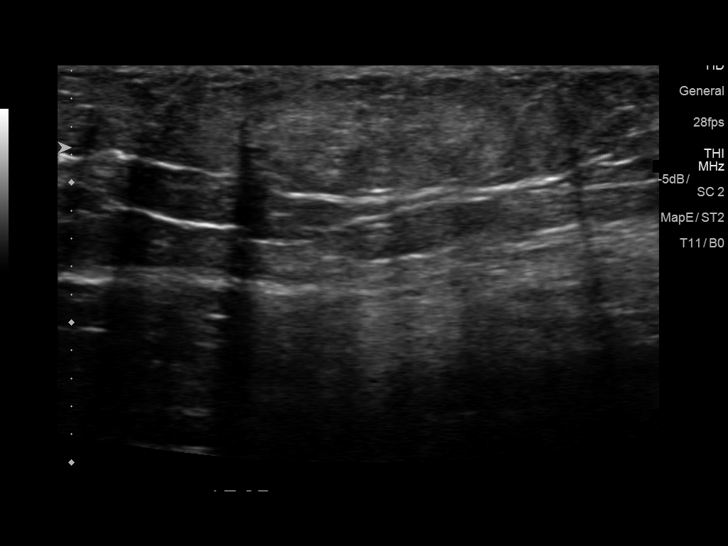
[im 6/15]
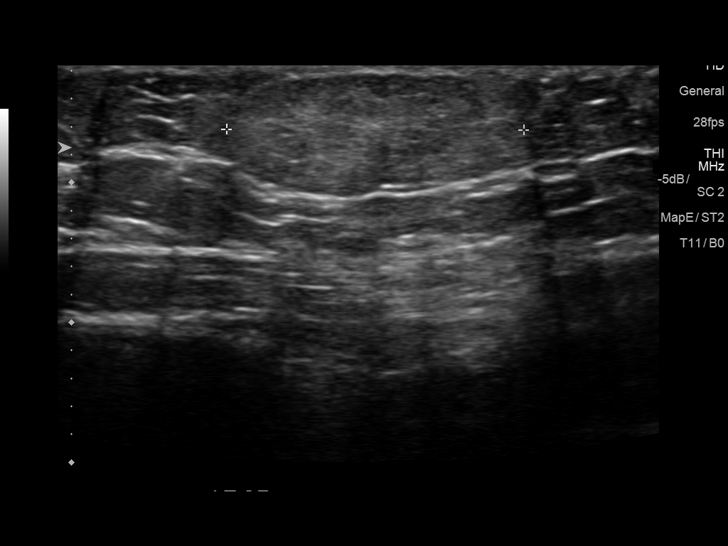
[im 7/15]
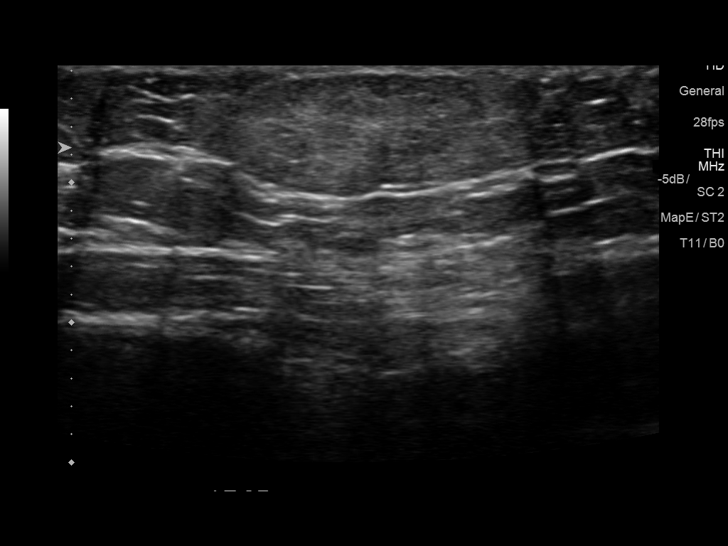
[im 9/15]
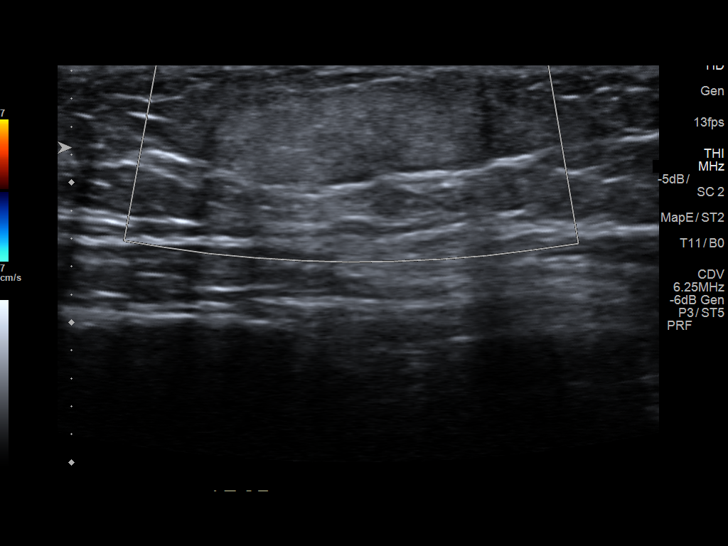
[im 10/15]
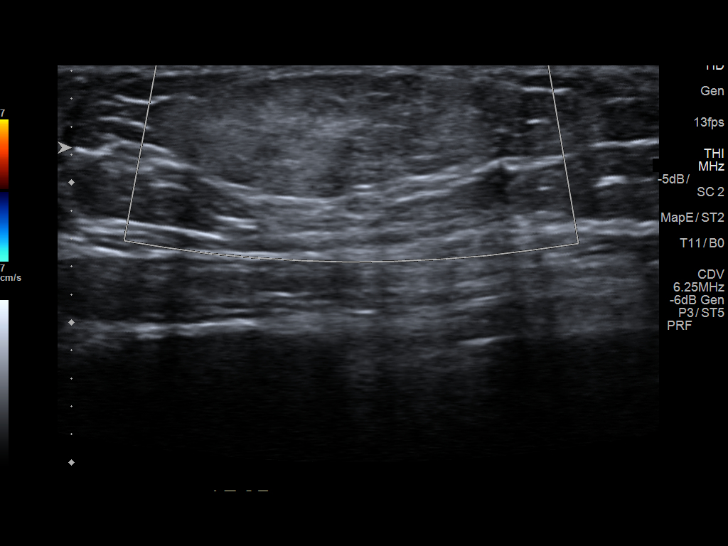
[im 11/15]
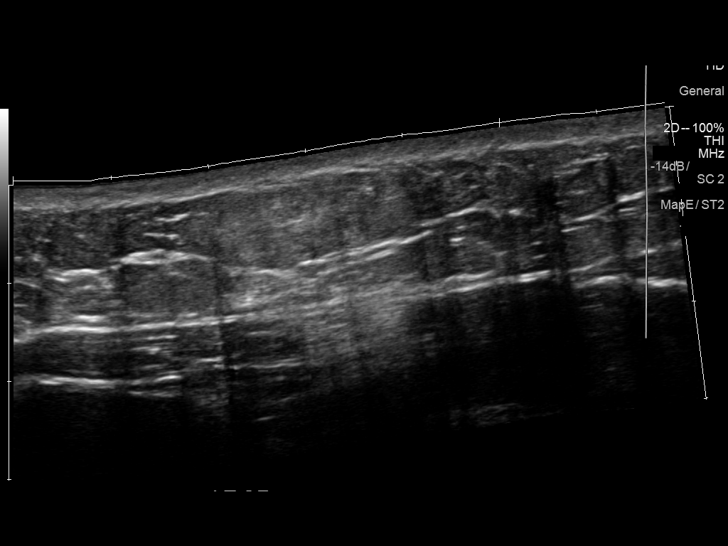
[im 12/15]
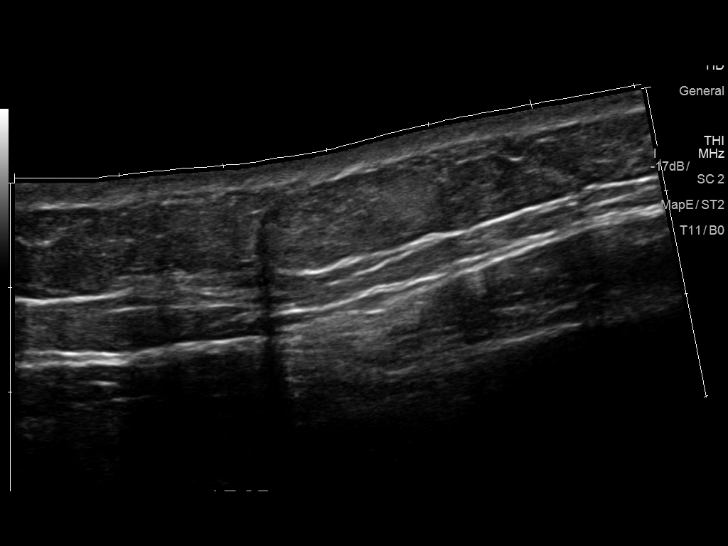
[im 13/15]
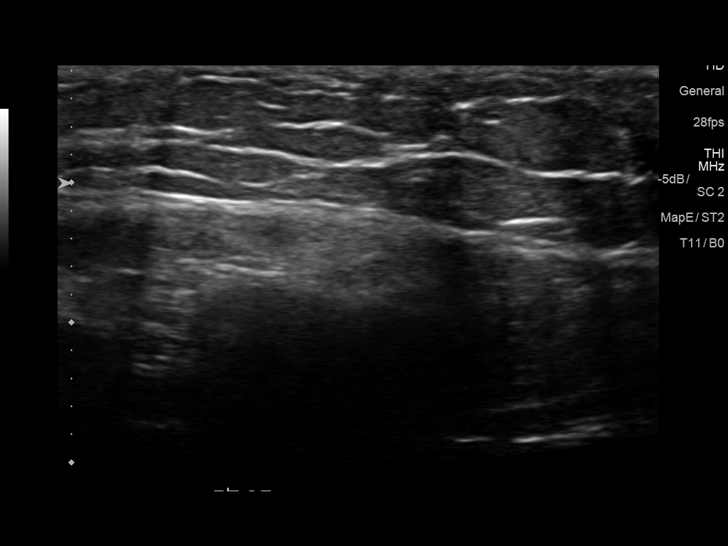
[im 14/15]
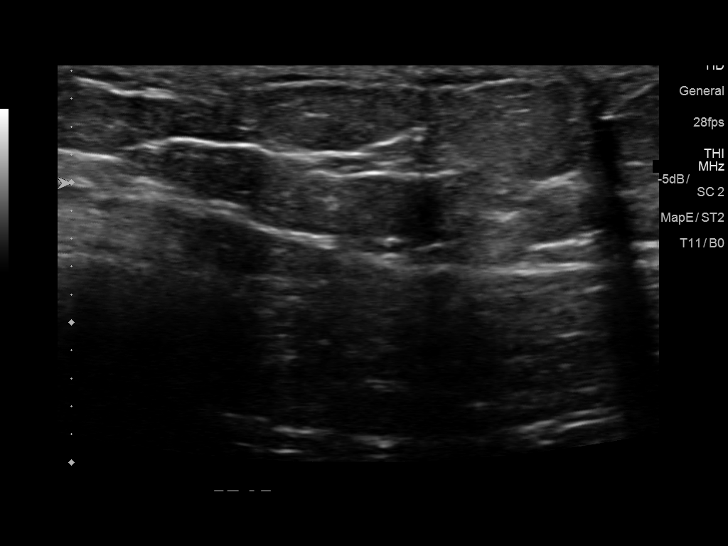
[im 15/15]
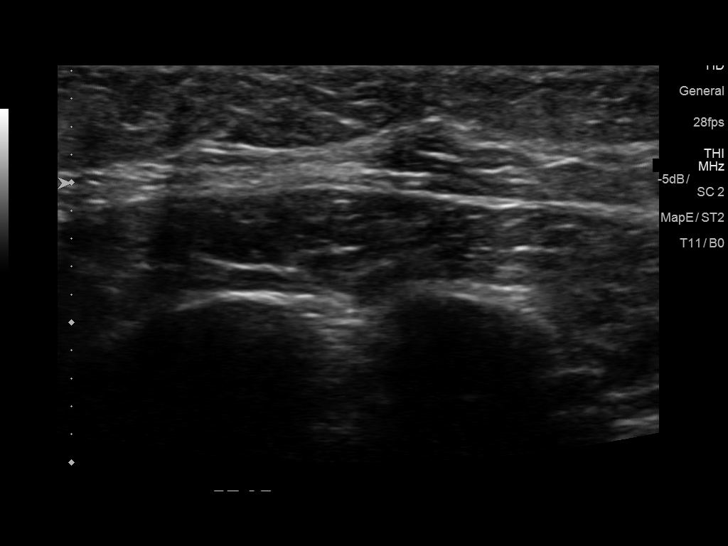

[14 of 15 positions shown; findings below may reference images not displayed]

FINDINGS: A 2.0 x 2.0 x 0.7 cm echogenic focus is noted just to the left of
the xiphoid.
IMPRESSION: Echogenic focus just to the left of the xiphoid as described. This
likely represents a focal lipoma within the muscular belly. Clinical
follow-up is recommended. If the area show significant enlargement
further evaluation is recommended by means of CT or MRI.

## 2018-05-22 ENCOUNTER — Encounter: Payer: Self-pay | Admitting: Family Medicine

## 2018-06-11 ENCOUNTER — Telehealth: Payer: Self-pay | Admitting: Family Medicine

## 2018-06-11 NOTE — Telephone Encounter (Signed)
Patient's fiance called and says he was around an employee 2-3 days ago who's wife was around someone who tested positive for COVID-19. She want's to know does the patient need to be staying home from work and self isolate himself. I asked about symptoms of patient and the employee, she says neither has symptoms. I advised according to literature, no self isolation is needed as long as no symptoms. I advised to tell him to monitor his temperature daily over the next 2 weeks and if there is a fever, cough he will need to self isolate, if there is SOB go to the ED. Self isolate x 7 days from the onset of symptoms or 72 hours after the symptoms subside, whichever is greater, she verbalized understanding.

## 2020-02-03 ENCOUNTER — Ambulatory Visit: Payer: BC Managed Care – PPO | Admitting: Family Medicine

## 2020-02-03 ENCOUNTER — Other Ambulatory Visit: Payer: Self-pay

## 2020-02-03 ENCOUNTER — Encounter: Payer: Self-pay | Admitting: Family Medicine

## 2020-02-03 VITALS — BP 133/77 | HR 90 | Temp 97.3°F | Ht 71.5 in | Wt 273.0 lb

## 2020-02-03 DIAGNOSIS — R002 Palpitations: Secondary | ICD-10-CM | POA: Diagnosis not present

## 2020-02-03 DIAGNOSIS — R0789 Other chest pain: Secondary | ICD-10-CM

## 2020-02-03 DIAGNOSIS — R55 Syncope and collapse: Secondary | ICD-10-CM | POA: Diagnosis not present

## 2020-02-03 DIAGNOSIS — J4521 Mild intermittent asthma with (acute) exacerbation: Secondary | ICD-10-CM

## 2020-02-03 DIAGNOSIS — F411 Generalized anxiety disorder: Secondary | ICD-10-CM

## 2020-02-03 MED ORDER — BUSPIRONE HCL 5 MG PO TABS: 5.0000 mg | ORAL_TABLET | Freq: Two times a day (BID) | ORAL | 1 refills | Status: DC

## 2020-02-03 MED ORDER — PROAIR RESPICLICK 108 (90 BASE) MCG/ACT IN AEPB: 1.0000 | INHALATION_SPRAY | RESPIRATORY_TRACT | 0 refills | Status: DC | PRN

## 2020-02-03 NOTE — Patient Instructions (Addendum)
Start buspirone once per day, then if tolerated after 1 week, increase to twice per day.  Make sure to drink plenty of fluids, avoid marijuana for now.  I will refer you to cariology to decide on further testing  Albuterol if needed for wheezing or shortness of breath. If needed more than twice per week, then daily med may be needed. Let me know.  Return to the clinic or go to the nearest emergency room if any of your symptoms worsen or new symptoms occur.   Palpitations Palpitations are feelings that your heartbeat is irregular or is faster than normal. It may feel like your heart is fluttering or skipping a beat. Palpitations are usually not a serious problem. They may be caused by many things, including smoking, caffeine, alcohol, stress, and certain medicines or drugs. Most causes of palpitations are not serious. However, some palpitations can be a sign of a serious problem. You may need further tests to rule out serious medical problems. Follow these instructions at home:     Pay attention to any changes in your condition. Take these actions to help manage your symptoms: Eating and drinking  Avoid foods and drinks that may cause palpitations. These may include: ? Caffeinated coffee, tea, soft drinks, diet pills, and energy drinks. ? Chocolate. ? Alcohol. Lifestyle  Take steps to reduce your stress and anxiety. Things that can help you relax include: ? Yoga. ? Mind-body activities, such as deep breathing, meditation, or using words and images to create positive thoughts (guided imagery). ? Physical activity, such as swimming, jogging, or walking. Tell your health care provider if your palpitations increase with activity. If you have chest pain or shortness of breath with activity, do not continue the activity until you are seen by your health care provider. ? Biofeedback. This is a method that helps you learn to use your mind to control things in your body, such as your heartbeat.  Do  not use drugs, including cocaine or ecstasy. Do not use marijuana.  Get plenty of rest and sleep. Keep a regular bed time. General instructions  Take over-the-counter and prescription medicines only as told by your health care provider.  Do not use any products that contain nicotine or tobacco, such as cigarettes and e-cigarettes. If you need help quitting, ask your health care provider.  Keep all follow-up visits as told by your health care provider. This is important. These may include visits for further testing if palpitations do not go away or get worse. Contact a health care provider if you:  Continue to have a fast or irregular heartbeat after 24 hours.  Notice that your palpitations occur more often. Get help right away if you:  Have chest pain or shortness of breath.  Have a severe headache.  Feel dizzy or you faint. Summary  Palpitations are feelings that your heartbeat is irregular or is faster than normal. It may feel like your heart is fluttering or skipping a beat.  Palpitations may be caused by many things, including smoking, caffeine, alcohol, stress, certain medicines, and drugs.  Although most causes of palpitations are not serious, some causes can be a sign of a serious medical problem.  Get help right away if you faint or have chest pain, shortness of breath, a severe headache, or dizziness. This information is not intended to replace advice given to you by your health care provider. Make sure you discuss any questions you have with your health care provider. Document Revised: 04/16/2017 Document Reviewed:  04/16/2017 Elsevier Patient Education  2020 Elsevier Inc.  Managing Anxiety, Adult After being diagnosed with an anxiety disorder, you may be relieved to know why you have felt or behaved a certain way. You may also feel overwhelmed about the treatment ahead and what it will mean for your life. With care and support, you can manage this condition and recover  from it. How to manage lifestyle changes Managing stress and anxiety  Stress is your body's reaction to life changes and events, both good and bad. Most stress will last just a few hours, but stress can be ongoing and can lead to more than just stress. Although stress can play a major role in anxiety, it is not the same as anxiety. Stress is usually caused by something external, such as a deadline, test, or competition. Stress normally passes after the triggering event has ended.  Anxiety is caused by something internal, such as imagining a terrible outcome or worrying that something will go wrong that will devastate you. Anxiety often does not go away even after the triggering event is over, and it can become long-term (chronic) worry. It is important to understand the differences between stress and anxiety and to manage your stress effectively so that it does not lead to an anxious response. Talk with your health care provider or a counselor to learn more about reducing anxiety and stress. He or she may suggest tension reduction techniques, such as:  Music therapy. This can include creating or listening to music that you enjoy and that inspires you.  Mindfulness-based meditation. This involves being aware of your normal breaths while not trying to control your breathing. It can be done while sitting or walking.  Centering prayer. This involves focusing on a word, phrase, or sacred image that means something to you and brings you peace.  Deep breathing. To do this, expand your stomach and inhale slowly through your nose. Hold your breath for 3-5 seconds. Then exhale slowly, letting your stomach muscles relax.  Self-talk. This involves identifying thought patterns that lead to anxiety reactions and changing those patterns.  Muscle relaxation. This involves tensing muscles and then relaxing them. Choose a tension reduction technique that suits your lifestyle and personality. These techniques take  time and practice. Set aside 5-15 minutes a day to do them. Therapists can offer counseling and training in these techniques. The training to help with anxiety may be covered by some insurance plans. Other things you can do to manage stress and anxiety include:  Keeping a stress/anxiety diary. This can help you learn what triggers your reaction and then learn ways to manage your response.  Thinking about how you react to certain situations. You may not be able to control everything, but you can control your response.  Making time for activities that help you relax and not feeling guilty about spending your time in this way.  Visual imagery and yoga can help you stay calm and relax.  Medicines Medicines can help ease symptoms. Medicines for anxiety include:  Anti-anxiety drugs.  Antidepressants. Medicines are often used as a primary treatment for anxiety disorder. Medicines will be prescribed by a health care provider. When used together, medicines, psychotherapy, and tension reduction techniques may be the most effective treatment. Relationships Relationships can play a big part in helping you recover. Try to spend more time connecting with trusted friends and family members. Consider going to couples counseling, taking family education classes, or going to family therapy. Therapy can help you and others  better understand your condition. How to recognize changes in your anxiety Everyone responds differently to treatment for anxiety. Recovery from anxiety happens when symptoms decrease and stop interfering with your daily activities at home or work. This may mean that you will start to:  Have better concentration and focus. Worry will interfere less in your daily thinking.  Sleep better.  Be less irritable.  Have more energy.  Have improved memory. It is important to recognize when your condition is getting worse. Contact your health care provider if your symptoms interfere with home or  work and you feel like your condition is not improving. Follow these instructions at home: Activity  Exercise. Most adults should do the following: ? Exercise for at least 150 minutes each week. The exercise should increase your heart rate and make you sweat (moderate-intensity exercise). ? Strengthening exercises at least twice a week.  Get the right amount and quality of sleep. Most adults need 7-9 hours of sleep each night. Lifestyle   Eat a healthy diet that includes plenty of vegetables, fruits, whole grains, low-fat dairy products, and lean protein. Do not eat a lot of foods that are high in solid fats, added sugars, or salt.  Make choices that simplify your life.  Do not use any products that contain nicotine or tobacco, such as cigarettes, e-cigarettes, and chewing tobacco. If you need help quitting, ask your health care provider.  Avoid caffeine, alcohol, and certain over-the-counter cold medicines. These may make you feel worse. Ask your pharmacist which medicines to avoid. General instructions  Take over-the-counter and prescription medicines only as told by your health care provider.  Keep all follow-up visits as told by your health care provider. This is important. Where to find support You can get help and support from these sources:  Self-help groups.  Online and [employer].  A trusted spiritual leader.  Couples counseling.  Family education classes.  Family therapy. Where to find more information You may find that joining a support group helps you deal with your anxiety. The following sources can help you locate counselors or support groups near you:  Mental Health America: www.mentalhealthamerica.net  Anxiety and Depression Association of [place] (ADAA): ProgramCam.de  [employer] Mental Illness (NAMI): www.nami.org Contact a health care provider if you:  Have a hard time staying focused or finishing daily tasks.  Spend many  hours a day feeling worried about everyday life.  Become exhausted by worry.  Start to have headaches, feel tense, or have nausea.  Urinate more than normal.  Have diarrhea. Get help right away if you have:  A racing heart and shortness of breath.  Thoughts of hurting yourself or others. If you ever feel like you may hurt yourself or others, or have thoughts about taking your own life, get help right away. You can go to your nearest emergency department or call:  Your local emergency services (911 in the [place]  A suicide crisis helpline, such as the National Suicide Prevention Lifeline at (614)612-6654. This is open 24 hours a day. Summary  Taking steps to learn and use tension reduction techniques can help calm you and help prevent triggering an anxiety reaction.  When used together, medicines, psychotherapy, and tension reduction techniques may be the most effective treatment.  Family, friends, and partners can play a big part in helping you recover from an anxiety disorder. This information is not intended to replace advice given to you by your health care provider. Make sure you discuss any  questions you have with your health care provider. Document Revised: 08/04/2018 Document Reviewed: 08/04/2018 Elsevier Patient Education  [employer].   If you have lab work done today you will be contacted with your lab results within the next 2 weeks.  If you have not heard from [place] then please contact us. The fastest way to get your results is to register for My Chart.   IF you received an x-ray today, you will receive an invoice from [facility]. Please contact [facility] at [phone] with questions or concerns regarding your invoice.   IF you received labwork today, you will receive an invoice from [facility]. Please contact [facility] at [phone] with questions or concerns regarding your invoice.   Our billing staff will not be able to assist you with  questions regarding bills from these companies.  You will be contacted with the lab results as soon as they are available. The fastest way to get your results is to activate your My Chart account. Instructions are located on the last page of this paperwork. If you have not heard from [place] regarding the results in 2 weeks, please contact this office.

## 2020-02-03 NOTE — Progress Notes (Signed)
Subjective:  Patient ID: Antonio Kelley, male    DOB: [birth year]  Age: 29 y.o. MRN: [id number]  CC:  Chief Complaint  Patient presents with  . Anxiety    pt reports feeling some anxiety contantly for a month and1/2. pt is unaware of any triggers. pt also reports he is concerned about his HR pt states he feels like it has been high. pt reports no chest pain, but some chest tightness generalized across the whole chest. pt thinks it may be linked to tha anxiety.    HPI Antonio Kelley presents for   Anxiety Situational anxiety in past for as long as he can remember, counseling in HS, not recent. No meds.  Past 6 weeks nonstop anxiety. 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 Notes more during day. Better at night. Wife had miscarriage about 6 weeks ago, but does not feel like that has been issue - has gone through before and wife handling ok.  Feeling of chest tightness, no associated arm pain. Does feel short of breath at the time - can breath ok, but feels like can't get deep breath. 2-3 times per day.   sweating during day. Tightness generalized across the whole chest, lasts few hours - feeling anxious, also feels like the heart rate is increased at times - palpitations mostly at night. Resting HR 99.  Feels anxious currently.  Less activity past year with staying at home. No regular exercise, tried riding bike few weeks ago - [employer]. Lightheaded after . Had intercourse prior. Prior week had ridden on bike without symptoms.  No melena/hematochezia.  Rare wheezing with symptoms. Similar sx's with anxiety in past.  Past 5 days overall feeling better. Less chest tightness.    No treatment.  Caffeine: quit for awhile, now 1 cup some mornings. Less than in past (prior worse anxiety) Alcohol: none Marijuana: daily until 8 days ago. Had been cutting back, more anxiety with use, then tried delta 8.  Passed out 11 days ago after smoking standard marijuana, 2 episodes of LOC - witnessed on 2nd  episode, lasted seconds, no shaking/seizure activity or incontinence.  occurred once several years ago after smoking. Same feeling.   Depression screen [facility] 2/9 02/03/2020 04/23/2018 03/31/2018 06/06/2017 11/22/2016  Decreased Interest 0 0 0 0 0  Down, Depressed, Hopeless 0 0 0 0 0  PHQ - 2 Score 0 0 0 0 0   GAD 7 : Generalized Anxiety Score 02/03/2020  Nervous, Anxious, on Edge 3  Control/stop worrying 3  Worry too much - different things 1  Trouble relaxing 3  Restless 3  Easily annoyed or irritable 1  Afraid - awful might happen 3  Total GAD 7 Score 17    Mild intermittent asthma: No recent use of albuterol. Out of med. Has thought to use few times with symtpoms.  No known FH of early CAD.    History Patient Active Problem List   Diagnosis Date Noted  . Migraine without aura and without status migrainosus, not intractable 06/06/2017  . Mild intermittent asthma with acute exacerbation 06/06/2017  . Acute nonintractable headache 11/22/2016   Past Medical History:  Diagnosis Date  . Allergy   . Asthma   . Hypertension    Past Surgical History:  Procedure Laterality Date  . FINGER SURGERY     No Known Allergies Prior to Admission medications   Medication Sig Start Date End Date Taking? Authorizing Provider  Albuterol Sulfate (PROAIR RESPICLICK) 108 (90 Base) MCG/ACT AEPB Inhale 1 Inhaler  into the lungs every 4 (four) hours as needed. Patient taking differently: Inhale 2 puffs into the lungs every 4 (four) hours as needed.  08/08/16   Shade Flood, MD  cyclobenzaprine (FLEXERIL) 5 MG tablet TAKE 1 TABLET BY MOUTH THREE TIMES DAILY AS NEEDED 12/26/16   Wallis Bamberg, PA-C  omeprazole (PRILOSEC) 20 MG capsule Take 1 capsule (20 mg total) by mouth daily. 03/31/18   Shade Flood, MD  predniSONE (DELTASONE) 10 MG tablet Take 40 mg once daily x 4 days, then 30mg  x 3 days, then 20mg  x 3 days, then 10mg  x 3 days. 04/23/18   McVey, , PA-C   Social History    Socioeconomic History  . Marital status: Significant Other    Spouse name: Not on file  . Number of children: 1  . Years of education: Not on file  . Highest education level: Not on file  Occupational History  . Not on file  Tobacco Use  . Smoking status: Former Smoker    Types: Cigarettes  . Smokeless tobacco: Former  . Vaping Use: Every day  Substance and Sexual Activity  . Alcohol use: Yes    Alcohol/week: 0.0 standard drinks    Comment: occ 3 times a year  . Drug use: No  . Sexual activity: Yes  Other Topics Concern  . Not on file  Social History Narrative   Lives at home with his significant other   Caffeine: 1-2 cups daily   Social Determinants of Health   Financial Resource Strain:   . Difficulty of Paying Living Expenses: Not on file  Food Insecurity:   . Worried About 06/22/18 in the Last Year: Not on file  . Ran Out of Food in the Last Year: Not on file  Transportation Needs:   . Lack of Transportation (Medical): Not on file  . Lack of Transportation (Non-Medical): Not on file  Physical Activity:   . Days of Exercise per Week: Not on file  . Minutes of Exercise per Session: Not on file  Stress:   . Feeling of Stress : Not on file  Social Connections:   . Frequency of Communication with Friends and Family: Not on file  . Frequency of Social Gatherings with Friends and Family: Not on file  . Attends Religious Services: Not on file  . Active Member of Clubs or Organizations: Not on file  . Attends [employer]aine [employer] Meetings: Not on file  . Marital Status: Not on file  Intimate Partner Violence:   . Fear of Current or Ex-Partner: Not on file  . Emotionally Abused: Not on file  . Physically Abused: Not on file  . Sexually Abused: Not on file    Review of Systems   Objective:   Vitals:   02/03/20 1428  BP: 133/77  Pulse: 90  Temp: (!) 97.3 F (36.3 C)  TempSrc: Temporal  SpO2: 94%  Weight: 273 lb (123.8 kg)   Height: 5' 11.5" (1.816 m)     Physical Exam Vitals reviewed.  Constitutional:      General: He is not in acute distress.    Appearance: He is well-developed. He is obese. He is not ill-appearing, toxic-appearing or diaphoretic.  HENT:     Head: Normocephalic and atraumatic.  Eyes:     Pupils: Pupils are equal, round, and reactive to light.  Neck:     Vascular: No carotid bruit or JVD.  Cardiovascular:  Rate and Rhythm: Normal rate and regular rhythm.     Heart sounds: Normal heart sounds. No murmur heard.  No friction rub.  Pulmonary:     Effort: Pulmonary effort is normal.     Breath sounds: Normal breath sounds. No rales.  Skin:    General: Skin is warm and dry.  Neurological:     General: No focal deficit present.     Mental Status: He is alert and oriented to person, place, and time.  Psychiatric:        Mood and Affect: Mood normal.        Behavior: Behavior normal.        Thought Content: Thought content normal.     Comments: Some psychomotor agitation during visit.  Appropriate responses.        EKG sinus rhythm, rate 74, no acute findings, no prior EKG available for comparison.  36 minutes spent during visit, greater than 50% counseling and assimilation of information, chart review, and discussion of plan.   Assessment & Plan:  Antonio Kelley is a 29 y.o. male . Chest tightness - Plan: EKG 12-Lead, Ambulatory referral to Cardiology  Mild intermittent asthma with acute exacerbation - Plan: Albuterol Sulfate (PROAIR RESPICLICK) 108 (90 Base) MCG/ACT AEPB  Syncope, unspecified syncope type - Plan: Basic metabolic panel, CBC with Differential/Platelet, Ambulatory referral to Cardiology  Palpitations - Plan: CBC with Differential/Platelet, TSH, Ambulatory referral to Cardiology  Anxiety state - Plan: busPIRone (BUSPAR) 5 MG tablet  6 weeks of increased anxiety symptoms, similar in past with some chest symptoms in the past with his anxiety, similar recent  symptoms.  Did have episode of syncope 11 days ago after smoking marijuana, similar episode in the past after smoking marijuana.  Episode of lightheadedness on the bicycle after intercourse, previously able to ride bike without symptoms.  Likely anxiety related chest symptoms, no concerning findings on EKG.  -Handout given on palpitations and managing anxiety.  Start buspirone 5 mg daily initially then twice daily.  Avoid further use of marijuana at this time.  -Check TSH, CBC, BMP with palpitations and prior episode of syncope  -Refer to cardiology for further evaluation and decision on further testing.  ER/911 precautions given  -Refilled albuterol if needed for asthma symptoms with RTC precautions if frequent need or worsening symptoms.  ER precautions given.  Meds ordered this encounter  Medications  . Albuterol Sulfate (PROAIR RESPICLICK) 108 (90 Base) MCG/ACT AEPB    Sig: Inhale 1-2 puffs into the lungs every 4 (four) hours as needed.    Dispense:  1 each    Refill:  0    May change to albuterol inhaler 1-2 puffs inhaled every 4-6 hours as needed if less costly than Respiclick  . busPIRone (BUSPAR) 5 MG tablet    Sig: Take 1 tablet (5 mg total) by mouth 2 (two) times daily.    Dispense:  60 tablet    Refill:  1   Patient Instructions   Start buspirone once per day, then if tolerated after 1 week, increase to twice per day.  Make sure to drink plenty of fluids, avoid marijuana for now.  I will refer you to cariology to decide on further testing  Albuterol if needed for wheezing or shortness of breath. If needed more than twice per week, then daily med may be needed. Let me know.  Return to the clinic or go to the nearest emergency room if any of your symptoms worsen or new symptoms occur.  Palpitations Palpitations are feelings that your heartbeat is irregular or is faster than normal. It may feel like your heart is fluttering or skipping a beat. Palpitations are usually not a  serious problem. They may be caused by many things, including smoking, caffeine, alcohol, stress, and certain medicines or drugs. Most causes of palpitations are not serious. However, some palpitations can be a sign of a serious problem. You may need further tests to rule out serious medical problems. Follow these instructions at home:     Pay attention to any changes in your condition. Take these actions to help manage your symptoms: Eating and drinking  Avoid foods and drinks that may cause palpitations. These may include: ? Caffeinated coffee, tea, soft drinks, diet pills, and energy drinks. ? Chocolate. ? Alcohol. Lifestyle  Take steps to reduce your stress and anxiety. Things that can help you relax include: ? Yoga. ? Mind-body activities, such as deep breathing, meditation, or using words and images to create positive thoughts (guided imagery). ? Physical activity, such as swimming, jogging, or walking. Tell your health care provider if your palpitations increase with activity. If you have chest pain or shortness of breath with activity, do not continue the activity until you are seen by your health care provider. ? Biofeedback. This is a method that helps you learn to use your mind to control things in your body, such as your heartbeat.  Do not use drugs, including cocaine or ecstasy. Do not use marijuana.  Get plenty of rest and sleep. Keep a regular bed time. General instructions  Take over-the-counter and prescription medicines only as told by your health care provider.  Do not use any products that contain nicotine or tobacco, such as cigarettes and e-cigarettes. If you need help quitting, ask your health care provider.  Keep all follow-up visits as told by your health care provider. This is important. These may include visits for further testing if palpitations do not go away or get worse. Contact a health care provider if you:  Continue to have a fast or irregular  heartbeat after 24 hours.  Notice that your palpitations occur more often. Get help right away if you:  Have chest pain or shortness of breath.  Have a severe headache.  Feel dizzy or you faint. Summary  Palpitations are feelings that your heartbeat is irregular or is faster than normal. It may feel like your heart is fluttering or skipping a beat.  Palpitations may be caused by many things, including smoking, caffeine, alcohol, stress, certain medicines, and drugs.  Although most causes of palpitations are not serious, some causes can be a sign of a serious medical problem.  Get help right away if you faint or have chest pain, shortness of breath, a severe headache, or dizziness. This information is not intended to replace advice given to you by your health care provider. Make sure you discuss any questions you have with your health care provider. Document Revised: 04/16/2017 Document Reviewed: 04/16/2017 Elsevier Patient Education  2020 Elsevier Inc.  Managing Anxiety, Adult After being diagnosed with an anxiety disorder, you may be relieved to know why you have felt or behaved a certain way. You may also feel overwhelmed about the treatment ahead and what it will mean for your life. With care and support, you can manage this condition and recover from it. How to manage lifestyle changes Managing stress and anxiety  Stress is your body's reaction to life changes and events, both good and bad. Most  stress will last just a few hours, but stress can be ongoing and can lead to more than just stress. Although stress can play a major role in anxiety, it is not the same as anxiety. Stress is usually caused by something external, such as a deadline, test, or competition. Stress normally passes after the triggering event has ended.  Anxiety is caused by something internal, such as imagining a terrible outcome or worrying that something will go wrong that will devastate you. Anxiety often does  not go away even after the triggering event is over, and it can become long-term (chronic) worry. It is important to understand the differences between stress and anxiety and to manage your stress effectively so that it does not lead to an anxious response. Talk with your health care provider or a counselor to learn more about reducing anxiety and stress. He or she may suggest tension reduction techniques, such as:  Music therapy. This can include creating or listening to music that you enjoy and that inspires you.  Mindfulness-based meditation. This involves being aware of your normal breaths while not trying to control your breathing. It can be done while sitting or walking.  Centering prayer. This involves focusing on a word, phrase, or sacred image that means something to you and brings you peace.  Deep breathing. To do this, expand your stomach and inhale slowly through your nose. Hold your breath for 3-5 seconds. Then exhale slowly, letting your stomach muscles relax.  Self-talk. This involves identifying thought patterns that lead to anxiety reactions and changing those patterns.  Muscle relaxation. This involves tensing muscles and then relaxing them. Choose a tension reduction technique that suits your lifestyle and personality. These techniques take time and practice. Set aside 5-15 minutes a day to do them. Therapists can offer counseling and training in these techniques. The training to help with anxiety may be covered by some insurance plans. Other things you can do to manage stress and anxiety include:  Keeping a stress/anxiety diary. This can help you learn what triggers your reaction and then learn ways to manage your response.  Thinking about how you react to certain situations. You may not be able to control everything, but you can control your response.  Making time for activities that help you relax and not feeling guilty about spending your time in this way.  Visual imagery  and yoga can help you stay calm and relax.  Medicines Medicines can help ease symptoms. Medicines for anxiety include:  Anti-anxiety drugs.  Antidepressants. Medicines are often used as a primary treatment for anxiety disorder. Medicines will be prescribed by a health care provider. When used together, medicines, psychotherapy, and tension reduction techniques may be the most effective treatment. Relationships Relationships can play a big part in helping you recover. Try to spend more time connecting with trusted friends and family members. Consider going to couples counseling, taking family education classes, or going to family therapy. Therapy can help you and others better understand your condition. How to recognize changes in your anxiety Everyone responds differently to treatment for anxiety. Recovery from anxiety happens when symptoms decrease and stop interfering with your daily activities at home or work. This may mean that you will start to:  Have better concentration and focus. Worry will interfere less in your daily thinking.  Sleep better.  Be less irritable.  Have more energy.  Have improved memory. It is important to recognize when your condition is getting worse. Contact your health care provider  if your symptoms interfere with home or work and you feel like your condition is not improving. Follow these instructions at home: Activity  Exercise. Most adults should do the following: ? Exercise for at least 150 minutes each week. The exercise should increase your heart rate and make you sweat (moderate-intensity exercise). ? Strengthening exercises at least twice a week.  Get the right amount and quality of sleep. Most adults need 7-9 hours of sleep each night. Lifestyle   Eat a healthy diet that includes plenty of vegetables, fruits, whole grains, low-fat dairy products, and lean protein. Do not eat a lot of foods that are high in solid fats, added sugars, or  salt.  Make choices that simplify your life.  Do not use any products that contain nicotine or tobacco, such as cigarettes, e-cigarettes, and chewing tobacco. If you need help quitting, ask your health care provider.  Avoid caffeine, alcohol, and certain over-the-counter cold medicines. These may make you feel worse. Ask your pharmacist which medicines to avoid. General instructions  Take over-the-counter and prescription medicines only as told by your health care provider.  Keep all follow-up visits as told by your health care provider. This is important. Where to find support You can get help and support from these sources:  Self-help groups.  Online and [employer].  A trusted spiritual leader.  Couples counseling.  Family education classes.  Family therapy. Where to find more information You may find that joining a support group helps you deal with your anxiety. The following sources can help you locate counselors or support groups near you:  Mental Health America: www.mentalhealthamerica.net  Anxiety and Depression Association of [place] (ADAA): ProgramCam.de  [employer] on Mental Illness (NAMI): www.nami.org Contact a health care provider if you:  Have a hard time staying focused or finishing daily tasks.  Spend many hours a day feeling worried about everyday life.  Become exhausted by worry.  Start to have headaches, feel tense, or have nausea.  Urinate more than normal.  Have diarrhea. Get help right away if you have:  A racing heart and shortness of breath.  Thoughts of hurting yourself or others. If you ever feel like you may hurt yourself or others, or have thoughts about taking your own life, get help right away. You can go to your nearest emergency department or call:  Your local emergency services (911 in the [place]  A suicide crisis helpline, such as the National Suicide Prevention Lifeline at (763) 472-9572. This is open 24  hours a day. Summary  Taking steps to learn and use tension reduction techniques can help calm you and help prevent triggering an anxiety reaction.  When used together, medicines, psychotherapy, and tension reduction techniques may be the most effective treatment.  Family, friends, and partners can play a big part in helping you recover from an anxiety disorder. This information is not intended to replace advice given to you by your health care provider. Make sure you discuss any questions you have with your health care provider. Document Revised: 08/04/2018 Document Reviewed: 08/04/2018 Elsevier Patient Education  [employer].   If you have lab work done today you will be contacted with your lab results within the next 2 weeks.  If you have not heard from [place] then please contact us. The fastest way to get your results is to register for My Chart.   IF you received an x-ray today, you will receive an invoice from [facility]. Please contact [facility] at  [phone] with questions or concerns regarding your invoice.   IF you received labwork today, you will receive an invoice from [facility]. Please contact [facility] at [phone] with questions or concerns regarding your invoice.   Our billing staff will not be able to assist you with questions regarding bills from these companies.  You will be contacted with the lab results as soon as they are available. The fastest way to get your results is to activate your My Chart account. Instructions are located on the last page of this paperwork. If you have not heard from [place] regarding the results in 2 weeks, please contact this office.         Signed, [name], MD [facility]

## 2020-02-04 LAB — CBC WITH DIFFERENTIAL/PLATELET
Basophils Absolute: 0.1 10*3/uL (ref 0.0–0.2)
Basos: 1 %
EOS (ABSOLUTE): 0.3 10*3/uL (ref 0.0–0.4)
Eos: 3 %
Hematocrit: 46.9 % (ref 37.5–51.0)
Hemoglobin: 16.4 g/dL (ref 13.0–17.7)
Immature Grans (Abs): 0.1 10*3/uL (ref 0.0–0.1)
Immature Granulocytes: 1 %
Lymphocytes Absolute: 2.9 10*3/uL (ref 0.7–3.1)
Lymphs: 32 %
MCH: 29.9 pg (ref 26.6–33.0)
MCHC: 35 g/dL (ref 31.5–35.7)
MCV: 85 fL (ref 79–97)
Monocytes Absolute: 0.8 10*3/uL (ref 0.1–0.9)
Monocytes: 9 %
Neutrophils Absolute: 4.7 10*3/uL (ref 1.4–7.0)
Neutrophils: 54 %
Platelets: 237 10*3/uL (ref 150–450)
RBC: 5.49 x10E6/uL (ref 4.14–5.80)
RDW: 13 % (ref 11.6–15.4)
WBC: 8.8 10*3/uL (ref 3.4–10.8)

## 2020-02-04 LAB — BASIC METABOLIC PANEL
BUN/Creatinine Ratio: 13 (ref 9–20)
BUN: 14 mg/dL (ref 6–20)
CO2: 19 mmol/L — ABNORMAL LOW (ref 20–29)
Calcium: 9.8 mg/dL (ref 8.7–10.2)
Chloride: 100 mmol/L (ref 96–106)
Creatinine, Ser: 1.09 mg/dL (ref 0.76–1.27)
GFR calc Af Amer: 104 mL/min/{1.73_m2} (ref >59)
GFR calc non Af Amer: 90 mL/min/{1.73_m2} (ref >59)
Glucose: 93 mg/dL (ref 65–99)
Potassium: 4 mmol/L (ref 3.5–5.2)
Sodium: 141 mmol/L (ref 134–144)

## 2020-02-04 LAB — TSH: TSH: 1.32 u[IU]/mL (ref 0.450–4.500)

## 2020-02-08 ENCOUNTER — Encounter: Payer: Self-pay | Admitting: Internal Medicine

## 2020-02-08 ENCOUNTER — Encounter: Payer: Self-pay | Admitting: *Deleted

## 2020-02-08 ENCOUNTER — Ambulatory Visit (INDEPENDENT_AMBULATORY_CARE_PROVIDER_SITE_OTHER): Payer: BC Managed Care – PPO | Admitting: Internal Medicine

## 2020-02-08 ENCOUNTER — Other Ambulatory Visit: Payer: Self-pay

## 2020-02-08 VITALS — BP 140/73 | HR 104 | Temp 98.8°F | Ht 61.0 in | Wt 277.2 lb

## 2020-02-08 DIAGNOSIS — R0789 Other chest pain: Secondary | ICD-10-CM | POA: Diagnosis not present

## 2020-02-08 DIAGNOSIS — R55 Syncope and collapse: Secondary | ICD-10-CM | POA: Diagnosis not present

## 2020-02-08 DIAGNOSIS — R002 Palpitations: Secondary | ICD-10-CM | POA: Diagnosis not present

## 2020-02-08 DIAGNOSIS — R000 Tachycardia, unspecified: Secondary | ICD-10-CM | POA: Diagnosis not present

## 2020-02-08 NOTE — Progress Notes (Signed)
OFFICE CONSULT NOTE  Chief Complaint:  Chest pressure, syncope  Primary Care Physician: Shade Flood, MD  HPI:  Antonio Kelley is a 29 y.o. male who is being seen today for the evaluation of chest pressure, syncope at the request of Shade Flood, MD.  This is a pleasant 29 year old male who has a history of situational anxiety, asthma and possible hypertension.  He is currently only taking buspirone and albuterol as needed.  More recently he had a couple of episodes of syncope.  The one episode was related to marijuana use.  He says he has had issues following that and the last episode he said he was playing video game with a friend.  He noted that he stood up and felt like he might pass out and eventually did.  He said he started to have tingling and numbness in his arms and graying of his vision prior to the event.  He then hit the floor.  He said he woke up and then tried to walk to open the door and get his wife's attention when he had another episode shortly after.  Since then he says he stopped using any marijuana.  He is also tried to make some dietary changes and increase his water intake.  He had a remote history of migraine headaches.  He says he is also had some presyncopal symptoms associated with blood draws and other things concerning for probable vasovagal syncope.  PMHx:  Past Medical History:  Diagnosis Date  . Allergy   . Asthma   . Hypertension     Past Surgical History:  Procedure Laterality Date  . FINGER SURGERY      FAMHx:  Family History  Problem Relation Age of Onset  . Cancer Mother   . Migraines Mother   . Cancer Maternal Grandmother     SOCHx:   reports that he has quit smoking. His smoking use included cigarettes. He has quit using smokeless tobacco. He reports current alcohol use. He reports that he does not use drugs.  ALLERGIES:  No Known Allergies  ROS: Pertinent items noted in HPI and remainder of comprehensive ROS otherwise  negative.  HOME MEDS: Current Outpatient Medications on File Prior to Visit  Medication Sig Dispense Refill  . Albuterol Sulfate (PROAIR RESPICLICK) 108 (90 Base) MCG/ACT AEPB Inhale 1-2 puffs into the lungs every 4 (four) hours as needed. 1 each 0  . busPIRone (BUSPAR) 5 MG tablet Take 1 tablet (5 mg total) by mouth 2 (two) times daily. 60 tablet 1   No current facility-administered medications on file prior to visit.    LABS/IMAGING: No results found for this or any previous visit (from the past 48 hour(s)). No results found.  LIPID PANEL: No results found for: CHOL, TRIG, HDL, CHOLHDL, VLDL, LDLCALC, LDLDIRECT  WEIGHTS: Wt Readings from Last 3 Encounters:  02/08/20 277 lb 3.2 oz (125.7 kg)  02/03/20 273 lb (123.8 kg)  04/23/18 251 lb (113.9 kg)    VITALS: BP 140/73   Pulse (!) 104   Temp 98.8 F (37.1 C)   Ht 5\' 1"  (1.549 m)   Wt 277 lb 3.2 oz (125.7 kg)   SpO2 96%   BMI 52.38 kg/m   EXAM: General appearance: alert and no distress Neck: no carotid bruit, no JVD and thyroid not enlarged, symmetric, no tenderness/mass/nodules Lungs: clear to auscultation bilaterally Heart: regular rate and rhythm, S1, S2 normal, no murmur, click, rub or gallop Abdomen: soft, non-tender; bowel sounds normal;  no masses,  no organomegaly Extremities: extremities normal, atraumatic, no cyanosis or edema Pulses: 2+ and symmetric Skin: Skin color, texture, turgor normal. No rashes or lesions Neurologic: Grossly normal Psych: Pleasant  EKG: Normal sinus rhythm at 74- personally reviewed (EKG performed at PCP on 02/03/2020)  ASSESSMENT: 1. Probable vasovagal syncope 2. Atypical chest pressure 3. Borderline hypertension 4. Anxiety 5. Tachycardia.  PLAN: 1.   Mr. Kinser had probable vasovagal syncope which may been exacerbated by marijuana use.  He says he stopped that at this point.  He is not had any further episodes.  He is also had some tachycardia and episodes of elevated  heart rate.  We will place a 3-day ZIO monitor to see if he is having any episodes of arrhythmias.  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  He had more anxiety and issues when he was working for [employer].  His chest pressure sounds atypical.  Again I suspect this is likely vasovagal syncope but would recommend a baseline echo and stress echo to rule out any possible ischemia, although my pretest probability for this is very low.  Follow-up with me afterwards.  Thanks again for the kind referral.  Chrystie Nose, MD, [facility]  [place]  [facility]  Medical Director of the [facility] &  Cardiovascular Risk Reduction Clinic Diplomate of the American Board of Clinical Lipidology Attending Cardiologist  Direct Dial: [phone]  Fax: [phone]  Website:  [email or web address]  Lisette Abu Domenico Achord 02/08/2020, 5:02 PM

## 2020-02-08 NOTE — Patient Instructions (Signed)
Medication Instructions:  Your physician recommends that you continue on your current medications as directed. Please refer to the Current Medication list given to you today.  *If you need a refill on your cardiac medications before your next appointment, please call your pharmacy*  Testing/Procedures:  Stress Echocardiogram @ [address]rd [address]  You will need to have the coronavirus test completed prior to your stress echo. An appointment has been made at ______ on  ______ . This is a Drive Up Visit at the [address] [place]. Please tell them that you are there for pre-procedure testing. Someone will direct you to the appropriate testing line. Stay in your car and someone will be with you shortly. Please make sure to have all other labs completed before this test because you will need to stay quarantined until your procedure. Please take your insurance card to this test.    ZIO XT- Long Term Monitor Instructions   Your physician has requested you wear your ZIO patch monitor 3 days.   This is a single patch monitor.  [employer] supplies one patch monitor per enrollment.  Additional stickers are not available.   Please do not apply patch if you will be having a Nuclear Stress Test, Echocardiogram, Cardiac CT, MRI, or Chest Xray during the time frame you would be wearing the monitor. The patch cannot be worn during these tests.  You cannot remove and re-apply the ZIO XT patch monitor.   Your ZIO patch monitor will be sent USPS Priority mail from [facility] directly to your home address. The monitor may also be mailed to a PO BOX if home delivery is not available.   It may take 3-5 days to receive your monitor after you have been enrolled.   Once you have received you monitor, please review enclosed instructions.  Your monitor has already been registered assigning a specific monitor serial # to you.   Applying the monitor   Shave hair from upper left chest.    Hold abrader disc by orange tab.  Rub abrader in 40 strokes over left upper chest as indicated in your monitor instructions.   Clean area with 4 enclosed alcohol pads .  Use all pads to assure are is cleaned thoroughly.  Let dry.   Apply patch as indicated in monitor instructions.  Patch will be place under collarbone on left side of chest with arrow pointing upward.   Rub patch adhesive wings for 2 minutes.Remove white label marked "1".  Remove white label marked "2".  Rub patch adhesive wings for 2 additional minutes.   While looking in a mirror, press and release button in center of patch.  A small green light will flash 3-4 times .  This will be your only indicator the monitor has been turned on.     Do not shower for the first 24 hours.  You may shower after the first 24 hours.   Press button if you feel a symptom. You will hear a small click.  Record Date, Time and Symptom in the Patient Log Book.   When you are ready to remove patch, follow instructions on last 2 pages of Patient Log Book.  Stick patch monitor onto last page of Patient Log Book.   Place Patient Log Book in [place] box.  Use locking tab on box and tape box closed securely.  The [employer] has [employer] on it.  Please place in mailbox as soon as  possible.  Your physician should have your test results approximately 7 days after the monitor has been mailed back to [facility].   Call [facility] Customer Care at [phone] if you have questions regarding your ZIO XT patch monitor.  Call them immediately if you see an orange light blinking on your monitor.   If your monitor falls off in less than 4 days contact our Monitor department at [phone].  If your monitor becomes loose or falls off after 4 days call [employer] at [phone] for suggestions on securing your monitor.     Follow-Up: At [facility], you and your health needs are our priority.  As part of our continuing mission to provide  you with exceptional heart care, we have created designated Provider Care Teams.  These Care Teams include your primary Cardiologist (physician) and Advanced Practice Providers (APPs -  Physician Assistants and Nurse Practitioners) who all work together to provide you with the care you need, when you need it.  We recommend signing up for the patient portal called "MyChart".  Sign up information is provided on this After Visit Summary.  MyChart is used to connect with patients for Virtual Visits (Telemedicine).  Patients are able to view lab/test results, encounter notes, upcoming appointments, etc.  Non-urgent messages can be sent to your provider as well.   To learn more about what you can do with MyChart, go to [email or web address].    Your next appointment:   Follow Up After Testing  The format for your next appointment:   In Person  Provider:   You may see Dr. Rennis Golden or one of the following Advanced Practice Providers on your designated Care Team:    Azalee Course, PA-C  Micah Flesher, [place] or   Judy Pimple, [place]    Other Instructions

## 2020-02-08 NOTE — Progress Notes (Signed)
Patient ID: Antonio Kelley, male   DOB: 1990/10/14, 29 y.o.   MRN: [id number] Patient enrolled for [employer] to ship a 3 day ZIO XT long term holter monitor to his home.

## 2020-02-11 ENCOUNTER — Ambulatory Visit (INDEPENDENT_AMBULATORY_CARE_PROVIDER_SITE_OTHER): Payer: BC Managed Care – PPO

## 2020-02-11 DIAGNOSIS — R0789 Other chest pain: Secondary | ICD-10-CM

## 2020-02-11 DIAGNOSIS — R000 Tachycardia, unspecified: Secondary | ICD-10-CM | POA: Diagnosis not present

## 2020-02-11 DIAGNOSIS — R55 Syncope and collapse: Secondary | ICD-10-CM | POA: Diagnosis not present

## 2020-02-11 DIAGNOSIS — R002 Palpitations: Secondary | ICD-10-CM | POA: Diagnosis not present

## 2020-02-29 ENCOUNTER — Encounter (HOSPITAL_COMMUNITY): Payer: Self-pay | Admitting: Internal Medicine

## 2020-03-09 ENCOUNTER — Telehealth (HOSPITAL_COMMUNITY): Payer: Self-pay | Admitting: Internal Medicine

## 2020-03-09 NOTE — Telephone Encounter (Signed)
Just an FYI. We have made several attempts to contact this patient including sending a letter to schedule or reschedule their  Stress echocardiogram. We will be removing the patient from the echo WQ.   02/29/20 MAILED LETTER LBW  02/29/20 Called and no VM is available @ 11:47/:BW  02/22/20 Pt states wife will callus back to schedule due to she will have to have her dates lines up @ 4:05/LBW  02/18/20 No VM set up @ 10:14/LBW  02/08/20 patient states he will call back to schedule kf    Thank you

## 2020-04-07 ENCOUNTER — Other Ambulatory Visit: Payer: Self-pay | Admitting: Family Medicine

## 2020-04-07 DIAGNOSIS — F411 Generalized anxiety disorder: Secondary | ICD-10-CM

## 2020-04-08 NOTE — Telephone Encounter (Signed)
Requested medications are due for refill today yes  Requested medications are on the active medication list yes  Last refill 03/07/20  Last visit 01/2020  Future visit scheduled no  Notes to clinic OV note was unclear if this med was just being tried, unsure if refill is planned, no upcoming appt.

## 2020-04-10 ENCOUNTER — Other Ambulatory Visit: Payer: Self-pay | Admitting: Family Medicine

## 2020-04-10 DIAGNOSIS — F411 Generalized anxiety disorder: Secondary | ICD-10-CM

## 2020-05-17 ENCOUNTER — Other Ambulatory Visit: Payer: Self-pay | Admitting: Family Medicine

## 2020-05-17 DIAGNOSIS — F411 Generalized anxiety disorder: Secondary | ICD-10-CM

## 2020-05-19 ENCOUNTER — Ambulatory Visit (INDEPENDENT_AMBULATORY_CARE_PROVIDER_SITE_OTHER): Payer: No Typology Code available for payment source | Admitting: Family Medicine

## 2020-05-19 ENCOUNTER — Other Ambulatory Visit: Payer: Self-pay

## 2020-05-19 ENCOUNTER — Encounter: Payer: Self-pay | Admitting: Family Medicine

## 2020-05-19 VITALS — BP 138/72 | HR 100 | Temp 98.6°F | Resp 16 | Ht 71.0 in | Wt 286.8 lb

## 2020-05-19 DIAGNOSIS — J309 Allergic rhinitis, unspecified: Secondary | ICD-10-CM

## 2020-05-19 DIAGNOSIS — H9311 Tinnitus, right ear: Secondary | ICD-10-CM | POA: Diagnosis not present

## 2020-05-19 DIAGNOSIS — H9201 Otalgia, right ear: Secondary | ICD-10-CM

## 2020-05-19 DIAGNOSIS — H6121 Impacted cerumen, right ear: Secondary | ICD-10-CM

## 2020-05-19 MED ORDER — AMOXICILLIN 875 MG PO TABS: 875.0000 mg | ORAL_TABLET | Freq: Two times a day (BID) | ORAL | 0 refills | Status: DC

## 2020-05-19 MED ORDER — OFLOXACIN 0.3 % OT SOLN: 5.0000 [drp] | Freq: Every day | OTIC | 0 refills | Status: AC

## 2020-05-19 NOTE — Patient Instructions (Addendum)
Hearing appears normal.  Start eardrops 5 drops/day for possible irritation of canal and antibiotic 1 pill twice per day for possible early middle ear infection.  I would also recommend trying Flonase nasal spray at night in case some of the nasal congestion or allergies are contributing to your right ear symptoms.  I did place a referral to ear nose and throat specialist for next week.  If any worsening symptoms prior to that time including any hearing loss, be seen right away.  Return to the clinic or go to the nearest emergency room if any of your symptoms worsen or new symptoms occur.     Tinnitus Tinnitus refers to hearing a sound when there is no actual source for that sound. This is often described as ringing in the ears. However, people with this condition may hear a variety of noises, in one ear or in both ears. The sounds of tinnitus can be soft, loud, or somewhere in between. Tinnitus can last for a few seconds or can be constant for days. It may go away without treatment and come back at various times. When tinnitus is constant or happens often, it can lead to other problems, such as trouble sleeping and trouble concentrating. Almost everyone experiences tinnitus at some point. Tinnitus that is long-lasting (chronic) or comes back often (recurs) may require medical attention. What are the causes? The cause of tinnitus is often not known. In some cases, it can result from:  Exposure to loud noises from machinery, music, or other sources.  An object (foreign body) stuck in the ear.  Earwax buildup.  Drinking alcohol or caffeine.  Taking certain medicines.  Age-related hearing loss. It may also be caused by medical conditions such as:  Ear or sinus infections.  High blood pressure.  Heart diseases.  Anemia.  Allergies.  Meniere's disease.  Thyroid problems.  Tumors.  A weak, bulging blood vessel (aneurysm) near the ear. What are the signs or symptoms? The main  symptom of tinnitus is hearing a sound when there is no source for that sound. It may sound like:  Buzzing.  Roaring.  Ringing.  Blowing air.  Hissing.  Whistling.  Sizzling.  Humming.  Running water.  A musical note.  Tapping. Symptoms may affect only one ear (unilateral) or both ears (bilateral). How is this diagnosed? Tinnitus is diagnosed based on your symptoms, your medical history, and a physical exam. Your health care provider may do a thorough hearing test (audiologic exam) if your tinnitus:  Is unilateral.  Causes hearing difficulties.  Lasts 6 months or longer. You may work with a health care provider who specializes in hearing disorders (audiologist). You may be asked questions about your symptoms and how they affect your daily life. You may have other tests done, such as:  CT scan.  MRI.  An imaging test of how blood flows through your blood vessels (angiogram). How is this treated? Treating an underlying medical condition can sometimes make tinnitus go away. If your tinnitus continues, other treatments may include:  Medicines.  Therapy and counseling to help you manage the stress of living with tinnitus.  Sound generators to mask the tinnitus. These include: ? Tabletop sound machines that play relaxing sounds to help you fall asleep. ? Wearable devices that fit in your ear and play sounds or music. ? Acoustic neural stimulation. This involves using headphones to listen to music that contains an auditory signal. Over time, listening to this signal may change some pathways in your  brain and make you less sensitive to tinnitus. This treatment is used for very severe cases when no other treatment is working.  Using hearing aids or cochlear implants if your tinnitus is related to hearing loss. Hearing aids are worn in the outer ear. Cochlear implants are surgically placed in the inner ear. Follow these instructions at home: Managing symptoms  When  possible, avoid being in loud places and being exposed to loud sounds.  Wear hearing protection, such as earplugs, when you are exposed to loud noises.  Use a white noise machine, a humidifier, or other devices to mask the sound of tinnitus.  Practice techniques for reducing stress, such as meditation, yoga, or deep breathing. Work with your health care provider if you need help with managing stress.  Sleep with your head slightly raised. This may reduce the impact of tinnitus.      General instructions  Do not use stimulants, such as nicotine, alcohol, or caffeine. Talk with your health care provider about other stimulants to avoid. Stimulants are substances that can make you feel alert and attentive by increasing certain activities in the body (such as heart rate and blood pressure). These substances may make tinnitus worse.  Take over-the-counter and prescription medicines only as told by your health care provider.  Try to get plenty of sleep each night.  Keep all follow-up visits as told by your health care provider. This is important. Contact a health care provider if:  Your tinnitus continues for 3 weeks or longer without stopping.  You develop sudden hearing loss.  Your symptoms get worse or do not get better with home care.  You feel you are not able to manage the stress of living with tinnitus. Get help right away if:  You develop tinnitus after a head injury.  You have tinnitus along with any of the following: ? Dizziness. ? Loss of balance. ? Nausea and vomiting. ? Sudden, severe headache. These symptoms may represent a serious problem that is an emergency. Do not wait to see if the symptoms will go away. Get medical help right away. Call your local emergency services (911 in the [place] Do not drive yourself to the hospital. Summary  Tinnitus refers to hearing a sound when there is no actual source for that sound. This is often described as ringing in the  ears.  Symptoms may affect only one ear (unilateral) or both ears (bilateral).  Use a white noise machine, a humidifier, or other devices to mask the sound of tinnitus.  Do not use stimulants, such as nicotine, alcohol, or caffeine. Talk with your health care provider about other stimulants to avoid. These substances may make tinnitus worse. This information is not intended to replace advice given to you by your health care provider. Make sure you discuss any questions you have with your health care provider. Document Revised: 09/15/2018 Document Reviewed: 12/12/2016 Elsevier Patient Education  2021 [employer].   If you have lab work done today you will be contacted with your lab results within the next 2 weeks.  If you have not heard from [place] then please contact us. The fastest way to get your results is to register for My Chart.   IF you received an x-ray today, you will receive an invoice from [facility]. Please contact [facility] at [phone] with questions or concerns regarding your invoice.   IF you received labwork today, you will receive an invoice from [facility]. Please contact [facility] at [phone] with questions or  concerns regarding your invoice.   Our billing staff will not be able to assist you with questions regarding bills from these companies.  You will be contacted with the lab results as soon as they are available. The fastest way to get your results is to activate your My Chart account. Instructions are located on the last page of this paperwork. If you have not heard from [place] regarding the results in 2 weeks, please contact this office.

## 2020-05-19 NOTE — Progress Notes (Signed)
Subjective:  Patient ID: Antonio Kelley, male    DOB: 01-Oct-1990  Age: [age] y.o. MRN: [id number]  CC:  Chief Complaint  Patient presents with  . Tinnitus    On the right side only. Going on for about 4 weeks. And it is a loud pitch ringing     HPI Antonio SailorsCody Pohle presents for   Right-sided tinnitus  Started 3-4 weeks ago - woke up from a nap - noticed ringing on R only. Initially episodic, now persistent past few days. Sore today. Hearing seems to be fine High pitch noise.  No d/c Uses q tips, nki, no bleeding.  8 years ago - had temporary loss of hearing for about a month, self resolved. No eval.  Noise exposure:no recent loud noise exposure. 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  2nd child on the way - expecting a daughter.  No firearm use.  No treatments.  Some chronic rhinorrhea in am. No current allergy meds. Nasal congestion in am. More sneezing recently. No pop.    History Patient Active Problem List   Diagnosis Date Noted  . Migraine without aura and without status migrainosus, not intractable 06/06/2017  . Mild intermittent asthma with acute exacerbation 06/06/2017  . Acute nonintractable headache 11/22/2016   Past Medical History:  Diagnosis Date  . Allergy   . Asthma   . Hypertension    Past Surgical History:  Procedure Laterality Date  . FINGER SURGERY     No Known Allergies Prior to Admission medications   Medication Sig Start Date End Date Taking? Authorizing Provider  Albuterol Sulfate (PROAIR RESPICLICK) 108 (90 Base) MCG/ACT AEPB Inhale 1-2 puffs into the lungs every 4 (four) hours as needed. 02/03/20  Yes Shade FloodGreene, Sharrieff Spratlin R, MD  busPIRone (BUSPAR) 5 MG tablet Take 1 tablet by mouth twice daily 05/17/20  Yes Shade FloodGreene, Pama Roskos R, MD   Social History   Socioeconomic History  . Marital status: Significant Other    Spouse name: Not on file  . Number of children: 1  . Years of education: Not on file  . Highest education level: Not on file  Occupational History  .  Not on file  Tobacco Use  . Smoking status: Former Smoker    Types: Cigarettes  . Smokeless tobacco: Former Clinical biochemistUser  Vaping Use  . Vaping Use: Every day  Substance and Sexual Activity  . Alcohol use: Yes    Alcohol/week: 0.0 standard drinks    Comment: occ 3 times a year  . Drug use: No  . Sexual activity: Yes  Other Topics Concern  . Not on file  Social History Narrative   Lives at home with his significant other   Caffeine: 1-2 cups daily   Social Determinants of Health   Financial Resource Strain: Not on file  Food Insecurity: Not on file  Transportation Needs: Not on file  Physical Activity: Not on file  Stress: Not on file  Social Connections: Not on file  Intimate Partner Violence: Not on file    Review of Systems Per hpi.   Objective:   Vitals:   05/19/20 1352 05/19/20 1355  BP: (!) 136/92 138/72  Pulse: 100   Resp: 16   Temp: 98.6 F (37 C)   TempSrc: Temporal   SpO2: 96%   Weight: 286 lb 12.8 oz (130.1 kg)   Height: 5\' 11"  (1.803 m)      Physical Exam Constitutional:      General: He is not in acute distress.  Appearance: He is well-developed and well-nourished.  HENT:     Head: Normocephalic and atraumatic.     Right Ear: Ear canal and external ear normal. There is impacted cerumen.     Left Ear: Tympanic membrane, ear canal and external ear normal.     Ears:     Comments: Nearly impacted cerumen on the right, unable to visualize TM but small opening within cerumen attempted removal with white curette with minimal success.  Denies hearing loss, no discharge or bleeding within the canal.  Weber test with equal hearing bilaterally.  Rinne test on right bone conduction 12 seconds air-conduction 20 seconds. Cardiovascular:     Rate and Rhythm: Normal rate.  Pulmonary:     Effort: Pulmonary effort is normal.  Neurological:     Mental Status: He is alert and oriented to person, place, and time.  Psychiatric:        Mood and Affect: Mood and affect  normal.    2:40 PM  Cerumen lavage performed, canal clear with slight erythema, no wounds or bleeding seen.  TM intact with clear to straw-colored fluid behind TM, slightly injected.   Assessment & Plan:  Cebastian Neis is a 30 y.o. male . Tinnitus aurium, right - Plan: Ambulatory referral to ENT  Excessive cerumen in ear canal, right - Plan: Ear wax removal  Allergic rhinitis, unspecified seasonality, unspecified trigger  Right ear pain - Plan: amoxicillin (AMOXIL) 875 MG tablet, ofloxacin (FLOXIN OTIC) 0.3 % OTIC solution, Ambulatory referral to ENT  Approximately 4-week history of initial intermittent tinnitus now progressive and constant.  No hearing loss, Weber, Rinne testing reassuring.  Slight erythema of canal, discomfort in canal and mild discoloration of fluid behind TM concerning for possible early infection.  Could also be due to nasal congestion, eustachian tube dysfunction in part.  -Start Floxin otic, amoxicillin for early otitis externa, media.  Start Flonase nasal spray for allergic rhinitis, eustachian tube component.  Refer to ENT.  RTC precautions.  Meds ordered this encounter  Medications  . amoxicillin (AMOXIL) 875 MG tablet    Sig: Take 1 tablet (875 mg total) by mouth 2 (two) times daily.    Dispense:  20 tablet    Refill:  0  . ofloxacin (FLOXIN OTIC) 0.3 % OTIC solution    Sig: Place 5 drops into the right ear daily for 7 days.    Dispense:  5 mL    Refill:  0   Patient Instructions    Hearing appears normal.  Start eardrops 5 drops/day for possible irritation of canal and antibiotic 1 pill twice per day for possible early middle ear infection.  I would also recommend trying Flonase nasal spray at night in case some of the nasal congestion or allergies are contributing to your right ear symptoms.  I did place a referral to ear nose and throat specialist for next week.  If any worsening symptoms prior to that time including any hearing loss, be seen right  away.  Return to the clinic or go to the nearest emergency room if any of your symptoms worsen or new symptoms occur.     Tinnitus Tinnitus refers to hearing a sound when there is no actual source for that sound. This is often described as ringing in the ears. However, people with this condition may hear a variety of noises, in one ear or in both ears. The sounds of tinnitus can be soft, loud, or somewhere in between. Tinnitus can last for a  few seconds or can be constant for days. It may go away without treatment and come back at various times. When tinnitus is constant or happens often, it can lead to other problems, such as trouble sleeping and trouble concentrating. Almost everyone experiences tinnitus at some point. Tinnitus that is long-lasting (chronic) or comes back often (recurs) may require medical attention. What are the causes? The cause of tinnitus is often not known. In some cases, it can result from:  Exposure to loud noises from machinery, music, or other sources.  An object (foreign body) stuck in the ear.  Earwax buildup.  Drinking alcohol or caffeine.  Taking certain medicines.  Age-related hearing loss. It may also be caused by medical conditions such as:  Ear or sinus infections.  High blood pressure.  Heart diseases.  Anemia.  Allergies.  Meniere's disease.  Thyroid problems.  Tumors.  A weak, bulging blood vessel (aneurysm) near the ear. What are the signs or symptoms? The main symptom of tinnitus is hearing a sound when there is no source for that sound. It may sound like:  Buzzing.  Roaring.  Ringing.  Blowing air.  Hissing.  Whistling.  Sizzling.  Humming.  Running water.  A musical note.  Tapping. Symptoms may affect only one ear (unilateral) or both ears (bilateral). How is this diagnosed? Tinnitus is diagnosed based on your symptoms, your medical history, and a physical exam. Your health care provider may do a thorough  hearing test (audiologic exam) if your tinnitus:  Is unilateral.  Causes hearing difficulties.  Lasts 6 months or longer. You may work with a health care provider who specializes in hearing disorders (audiologist). You may be asked questions about your symptoms and how they affect your daily life. You may have other tests done, such as:  CT scan.  MRI.  An imaging test of how blood flows through your blood vessels (angiogram). How is this treated? Treating an underlying medical condition can sometimes make tinnitus go away. If your tinnitus continues, other treatments may include:  Medicines.  Therapy and counseling to help you manage the stress of living with tinnitus.  Sound generators to mask the tinnitus. These include: ? Tabletop sound machines that play relaxing sounds to help you fall asleep. ? Wearable devices that fit in your ear and play sounds or music. ? Acoustic neural stimulation. This involves using headphones to listen to music that contains an auditory signal. Over time, listening to this signal may change some pathways in your brain and make you less sensitive to tinnitus. This treatment is used for very severe cases when no other treatment is working.  Using hearing aids or cochlear implants if your tinnitus is related to hearing loss. Hearing aids are worn in the outer ear. Cochlear implants are surgically placed in the inner ear. Follow these instructions at home: Managing symptoms  When possible, avoid being in loud places and being exposed to loud sounds.  Wear hearing protection, such as earplugs, when you are exposed to loud noises.  Use a white noise machine, a humidifier, or other devices to mask the sound of tinnitus.  Practice techniques for reducing stress, such as meditation, yoga, or deep breathing. Work with your health care provider if you need help with managing stress.  Sleep with your head slightly raised. This may reduce the impact of  tinnitus.      General instructions  Do not use stimulants, such as nicotine, alcohol, or caffeine. Talk with your health care provider  about other stimulants to avoid. Stimulants are substances that can make you feel alert and attentive by increasing certain activities in the body (such as heart rate and blood pressure). These substances may make tinnitus worse.  Take over-the-counter and prescription medicines only as told by your health care provider.  Try to get plenty of sleep each night.  Keep all follow-up visits as told by your health care provider. This is important. Contact a health care provider if:  Your tinnitus continues for 3 weeks or longer without stopping.  You develop sudden hearing loss.  Your symptoms get worse or do not get better with home care.  You feel you are not able to manage the stress of living with tinnitus. Get help right away if:  You develop tinnitus after a head injury.  You have tinnitus along with any of the following: ? Dizziness. ? Loss of balance. ? Nausea and vomiting. ? Sudden, severe headache. These symptoms may represent a serious problem that is an emergency. Do not wait to see if the symptoms will go away. Get medical help right away. Call your local emergency services (911 in the [place] Do not drive yourself to the hospital. Summary  Tinnitus refers to hearing a sound when there is no actual source for that sound. This is often described as ringing in the ears.  Symptoms may affect only one ear (unilateral) or both ears (bilateral).  Use a white noise machine, a humidifier, or other devices to mask the sound of tinnitus.  Do not use stimulants, such as nicotine, alcohol, or caffeine. Talk with your health care provider about other stimulants to avoid. These substances may make tinnitus worse. This information is not intended to replace advice given to you by your health care provider. Make sure you discuss any questions you have  with your health care provider. Document Revised: 09/15/2018 Document Reviewed: 12/12/2016 Elsevier Patient Education  2021 [employer].   If you have lab work done today you will be contacted with your lab results within the next 2 weeks.  If you have not heard from [place] then please contact us. The fastest way to get your results is to register for My Chart.   IF you received an x-ray today, you will receive an invoice from [facility]. Please contact [facility] at [phone] with questions or concerns regarding your invoice.   IF you received labwork today, you will receive an invoice from [facility]. Please contact [facility] at [phone] with questions or concerns regarding your invoice.   Our billing staff will not be able to assist you with questions regarding bills from these companies.  You will be contacted with the lab results as soon as they are available. The fastest way to get your results is to activate your My Chart account. Instructions are located on the last page of this paperwork. If you have not heard from [place] regarding the results in 2 weeks, please contact this office.          Signed, [name], MD [facility]

## 2020-11-16 ENCOUNTER — Ambulatory Visit (INDEPENDENT_AMBULATORY_CARE_PROVIDER_SITE_OTHER): Payer: No Typology Code available for payment source | Admitting: Family Medicine

## 2020-11-16 ENCOUNTER — Other Ambulatory Visit: Payer: Self-pay

## 2020-11-16 VITALS — BP 124/76 | HR 101 | Temp 98.0°F | Resp 17 | Ht 71.0 in | Wt 288.6 lb

## 2020-11-16 DIAGNOSIS — R5383 Other fatigue: Secondary | ICD-10-CM | POA: Diagnosis not present

## 2020-11-16 DIAGNOSIS — J4521 Mild intermittent asthma with (acute) exacerbation: Secondary | ICD-10-CM

## 2020-11-16 DIAGNOSIS — F418 Other specified anxiety disorders: Secondary | ICD-10-CM

## 2020-11-16 LAB — BASIC METABOLIC PANEL
BUN: 14 mg/dL (ref 6–23)
CO2: 23 mEq/L (ref 19–32)
Calcium: 9.5 mg/dL (ref 8.4–10.5)
Chloride: 101 mEq/L (ref 96–112)
Creatinine, Ser: 1.17 mg/dL (ref 0.40–1.50)
GFR: 82.42 mL/min (ref >60.00)
Glucose, Bld: 233 mg/dL — ABNORMAL HIGH (ref 70–99)
Potassium: 3.9 mEq/L (ref 3.5–5.1)
Sodium: 136 mEq/L (ref 135–145)

## 2020-11-16 LAB — CBC
HCT: 44.8 % (ref 39.0–52.0)
Hemoglobin: 15.6 g/dL (ref 13.0–17.0)
MCHC: 34.7 g/dL (ref 30.0–36.0)
MCV: 84.7 fl (ref 78.0–100.0)
Platelets: 197 10*3/uL (ref 150.0–400.0)
RBC: 5.29 Mil/uL (ref 4.22–5.81)
RDW: 13.6 % (ref 11.5–15.5)
WBC: 7.8 10*3/uL (ref 4.0–10.5)

## 2020-11-16 LAB — TSH: TSH: 1.59 u[IU]/mL (ref 0.35–5.50)

## 2020-11-16 MED ORDER — PROAIR RESPICLICK 108 (90 BASE) MCG/ACT IN AEPB: 1.0000 | INHALATION_SPRAY | RESPIRATORY_TRACT | 0 refills | Status: DC | PRN

## 2020-11-16 MED ORDER — SERTRALINE HCL 50 MG PO TABS: 50.0000 mg | ORAL_TABLET | Freq: Every day | ORAL | 3 refills | Status: DC

## 2020-11-16 NOTE — Patient Instructions (Addendum)
Ok to use albuterol as needed for now. If you require that med more frequently - can look at a different med to use daily as prevention med.   Try starting zoloft for anxiety and depression. Walking for exercise may also help.  Recheck in 4-6 weeks. Let me know if there are concerns/questions sooner.   Return to the clinic or go to the nearest emergency room if any of your symptoms worsen or new symptoms occur.   Managing Anxiety, Adult After being diagnosed with an anxiety disorder, you may be relieved to know why you have felt or behaved a certain way. You may also feel overwhelmed about the treatment ahead and what it will mean for your life. With care and support, you can manage this condition and recover from it. How to manage lifestyle changes Managing stress and anxiety Stress is your body's reaction to life changes and events, both good and bad. Most stress will last just a few hours, but stress can be ongoing and can lead to more than just stress. Although stress can play a major role in anxiety, it is not the same as anxiety. Stress is usually caused by something external, such as a deadline, test, or competition. Stress normally passes after the triggering event has ended.  Anxiety is caused by something internal, such as imagining a terrible outcome or worrying that something will go wrong that will devastate you. Anxiety often does not go away even after the triggering event is over, and it can become long-term (chronic) worry. It is important to understand the differences between stress and anxiety and to manage your stress effectively so that it does not lead to an anxious response. Talk with your health care provider or a counselor to learn more about reducing anxiety and stress. He or she may suggest tension reduction techniques, such as: Music therapy. This can include creating or listening to music that you enjoy and that inspires you. Mindfulness-based meditation. This involves  being aware of your normal breaths while not trying to control your breathing. It can be done while sitting or walking. Centering prayer. This involves focusing on a word, phrase, or sacred image that means something to you and brings you peace. Deep breathing. To do this, expand your stomach and inhale slowly through your nose. Hold your breath for 3-5 seconds. Then exhale slowly, letting your stomach muscles relax. Self-talk. This involves identifying thought patterns that lead to anxiety reactions and changing those patterns. Muscle relaxation. This involves tensing muscles and then relaxing them. Choose a tension reduction technique that suits your lifestyle and personality. These techniques take time and practice. Set aside 5-15 minutes a day to do them. Therapists can offer counseling and training in these techniques. The training to help with anxiety may be covered by some insurance plans. Other things you can do to manage stress and anxiety include: Keeping a stress/anxiety diary. This can help you learn what triggers your reaction and then learn ways to manage your response. Thinking about how you react to certain situations. You may not be able to control everything, but you can control your response. Making time for activities that help you relax and not feeling guilty about spending your time in this way. Visual imagery and yoga can help you stay calm and relax.  Medicines Medicines can help ease symptoms. Medicines for anxiety include: Anti-anxiety drugs. Antidepressants. Medicines are often used as a primary treatment for anxiety disorder. Medicines will be prescribed by a health care provider.  When used together, medicines, psychotherapy, and tension reduction techniques may be the most effective treatment. Relationships Relationships can play a big part in helping you recover. Try to spend more time connecting with trusted friends and family members. Consider going to couples  counseling, taking family education classes, or going to family therapy. Therapy can help you and others better understand your condition. How to recognize changes in your anxiety Everyone responds differently to treatment for anxiety. Recovery from anxiety happens when symptoms decrease and stop interfering with your daily activities at home or work. This may mean that you will start to: Have better concentration and focus. Worry will interfere less in your daily thinking. Sleep better. Be less irritable. Have more energy. Have improved memory. It is important to recognize when your condition is getting worse. Contact your health care provider if your symptoms interfere with home or work and you feel like your condition is not improving. Follow these instructions at home: Activity Exercise. Most adults should do the following: Exercise for at least 150 minutes each week. The exercise should increase your heart rate and make you sweat (moderate-intensity exercise). Strengthening exercises at least twice a week. Get the right amount and quality of sleep. Most adults need 7-9 hours of sleep each night. Lifestyle  Eat a healthy diet that includes plenty of vegetables, fruits, whole grains, low-fat dairy products, and lean protein. Do not eat a lot of foods that are high in solid fats, added sugars, or salt. Make choices that simplify your life. Do not use any products that contain nicotine or tobacco, such as cigarettes, e-cigarettes, and chewing tobacco. If you need help quitting, ask your health care provider. Avoid caffeine, alcohol, and certain over-the-counter cold medicines. These may make you feel worse. Ask your pharmacist which medicines to avoid. General instructions Take over-the-counter and prescription medicines only as told by your health care provider. Keep all follow-up visits as told by your health care provider. This is important. Where to find support You can get help and  support from these sources: Self-help groups. Online and [employer]. A trusted spiritual leader. Couples counseling. Family education classes. Family therapy. Where to find more information You may find that joining a support group helps you deal with your anxiety. The following sources can help you locate counselors or support groups near you: Mental Health America: www.mentalhealthamerica.net Anxiety and Depression Association of [place] (ADAA): ProgramCam.de [employer] Mental Illness (NAMI): www.nami.org Contact a health care provider if you: Have a hard time staying focused or finishing daily tasks. Spend many hours a day feeling worried about everyday life. Become exhausted by worry. Start to have headaches, feel tense, or have nausea. Urinate more than normal. Have diarrhea. Get help right away if you have: A racing heart and shortness of breath. Thoughts of hurting yourself or others. If you ever feel like you may hurt yourself or others, or have thoughts about taking your own life, get help right away. You can go to your nearest emergency department or call: Your local emergency services (911 in the [place] A suicide crisis helpline, such as the National Suicide Prevention Lifeline at 732-040-5962. This is open 24 hours a day. Summary Taking steps to learn and use tension reduction techniques can help calm you and help prevent triggering an anxiety reaction. When used together, medicines, psychotherapy, and tension reduction techniques may be the most effective treatment. Family, friends, and partners can play a big part in helping you recover from an anxiety disorder.  This information is not intended to replace advice given to you by your health care provider. Make sure you discuss any questions you have with your health care provider. Document Revised: 08/04/2018 Document Reviewed: 08/04/2018 Elsevier Patient Education  2022 [employer]    Asthma  Attack Prevention, Adult Although you may not be able to control the fact that you have asthma, you can take actions to prevent episodes of asthma (asthma attacks). How can this condition affect me? Asthma attacks (flare ups) can cause trouble breathing, wheezing, and coughing. They may keep you from doing activities you like to do. What can increase my risk? Coming into contact with things that cause asthma symptoms (asthma triggers) can put you at risk for an asthma attack. Common asthma triggers include: Things you are allergic to (allergens), such as: Dust mite and cockroach droppings. Pet dander. Mold. Pollen from trees and grasses. Food allergies. This might be a specific food or added chemicals called sulfites. Irritants, such as: Weather changes including very cold, dry, or humid air. Smoke. This includes campfire smoke, air pollution, and tobacco smoke. Strong odors from aerosol sprays and fumes from perfume, candles, and household cleaners. Other triggers, such as: Certain medicines. This includes NSAIDs, such as ibuprofen and aspirin. Viral respiratory infections (colds), including runny nose (rhinitis) or infection in the sinuses (sinusitis). Activity including exercise, laughing, or crying. Not using inhaled medicines (corticosteroids) as told. What actions can I take to prevent an asthma attack? Stay healthy. Stay up to date on all immunizations as told by your health care provider, including the yearly flu (influenza) vaccine and pneumonia vaccine. Many asthma attacks can be prevented by carefully following your written asthma action plan. Follow your asthma action plan Work with your health care provider to create a written asthma action plan. This plan should include: A list of your asthma triggers and how to avoid or reduce them. A list of symptoms that you may have during an asthma attack. Information about which medicine to take, when to take the medicine, and how much  of the medicine to take. Information to help you understand your peak flow measurements. Daily actions that you can take to prevent (control) your asthma symptoms. Contact information for your health care providers. If you have an asthma attack, act quickly. Follow the emergency steps on your written asthma action plan. This may prevent you from needing to go to the hospital. Monitor your asthma. To do this: Use your peak flow meter every morning and every evening for 2-3 weeks or as told by your health care provider. Record the results in a journal. A drop in your peak flow numbers on one or more days may mean that you are starting to have an asthma attack, even if you are not having symptoms. When you have asthma symptoms, write them down in a journal. Write down in your journal how often you need to use your fast-acting rescue inhaler. If you are using your rescue inhaler more often, it may mean that your asthma is not under control. Talk with your health care provider about adjusting your asthma treatment plan to help you prevent future asthma attacks and gain better control of your condition.  Lifestyle Avoid or reduce contact with known outdoor allergens by staying indoors, keeping windows closed, and using air conditioning when pollen and mold counts are high. Do not use any products that contain nicotine or tobacco, such as cigarettes, e-cigarettes, and chewing tobacco. If you need help quitting, ask your  health care provider. If you are overweight, consider a weight-loss plan. Find ways to cope with stress and your feelings, such as mindfulness, relaxation, or breathing exercises. Ask your health care provider if a breathing exercise program (pulmonary rehabilitation) may be helpful to control symptoms and improve your quality of life. Medicines  Take over-the-counter and prescription medicines only as told by your health care provider. Do not stop taking your medicine and do not take  less medicine even if you are doing well. Let your health care provider know: How often you use your rescue inhaler. How often you have symptoms when you are taking your regular medicines. If you wake up at night because of asthma symptoms. If you have more trouble with your breathing when you exercise. Activity Do your normal activities as told by your health care provider. Ask your health care provider what activities are safe for you. Some people have asthma symptoms or more asthma symptoms when they exercise. This is called exercise-induced bronchoconstriction (EIB). If you have this problem, talk with your health care provider about how to manage EIB. Some tips to follow include: Use your fast-acting inhaler before exercise. Exercise indoors if it is very cold, humid, or the pollen and mold counts are high. Warm up and cool down before and after exercise. Stop exercising right away if your asthma symptoms start or get worse. Where to find more information Asthma and Allergy Foundation of America: www.aafa.org Centers for Disease Control and Prevention: FootballExhibition.com.br American Lung Association: www.lung.org National Heart, Lung, and Blood Institute: PopSteam.is World Health Organization: https://castaneda-walker.com/ Get help right away if: You have followed your written asthma action plan and your symptoms are not improving. Summary Asthma attacks (flare ups) can cause trouble breathing, wheezing, and coughing. They may keep you from doing activities you normally like to do. Work with your health care provider to create a written asthma action plan. Do not stop taking your medicine and do not take less medicine even if you are doing well. Do not use any products that contain nicotine or tobacco, such as cigarettes, e-cigarettes, and chewing tobacco. If you need help quitting, ask your health care provider. This information is not intended to replace advice given to you by your health care provider.  Make sure you discuss any questions you have with your health care provider. Document Revised: 03/02/2019 Document Reviewed: 03/02/2019 Elsevier Patient Education  2022 [employer].

## 2020-11-16 NOTE — Progress Notes (Signed)
Subjective:  Patient ID: Antonio Kelley, male    DOB: 11-20-1990  Age: 30 y.o. MRN: [id number]  CC:  Chief Complaint  Patient presents with   Anxiety    Pt here for follow up noted stopped taking med due to "head rush" side effect and ringing in the ears, both sxs stopped once medication was stopped, pt sites no specific concerns today    Asthma    Pt in need of refill inhaler today no concerns     HPI Wendle Kina presents for   Mild intermittent asthma Treated with as needed albuterol, needs refill today.  Typical use about once per week if needed.  No nocturnal symptoms, no ER/urgent care eval needed.   Anxiety See previous notes, buspirone prescribed late last year.  Initially 5 mg daily, then option of twice daily dosing. Took meds for 3-4 months. Infrequent head rush feeling, ringing in the ears daily. but those symptoms stopped off meds.  Family/situational stressors previously.off buspirone for months now.  No prior SSRI.  Worse anxiety past few months. Same life stressors. No new changes, except new baby soon - induction planned on 9/10. 44 year old at time.  No SI/HI. Alcohol - none No IDU/CBD. Has not had marijuana since episode of syncope a year ago - no recurrence.  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 Does not want to meet with therapist.  No regular exercise. Fatigue - not much energy.  Combo of depression  Lab Results  Component Value Date   TSH 1.59 11/16/2020    GAD 7 : Generalized Anxiety Score 11/16/2020 02/03/2020  Nervous, Anxious, on Edge 3 3  Control/stop worrying 2 3  Worry too much - different things 2 1  Trouble relaxing 3 3  Restless 3 3  Easily annoyed or irritable 1 1  Afraid - awful might happen 1 3  Total GAD 7 Score 15 17    Depression screen [facility] 2/9 11/16/2020 05/19/2020 02/03/2020 04/23/2018 03/31/2018  Decreased Interest 2 0 0 0 0  Down, Depressed, Hopeless 1 0 0 0 0  PHQ - 2 Score 3 0 0 0 0  Altered sleeping 3 - - - -  Tired,  decreased energy 3 - - - -  Change in appetite 0 - - - -  Feeling bad or failure about yourself  0 - - - -  Trouble concentrating 0 - - - -  Moving slowly or fidgety/restless 1 - - - -  Suicidal thoughts 0 - - - -  PHQ-9 Score 10 - - - -     History Patient Active Problem List   Diagnosis Date Noted   Migraine without aura and without status migrainosus, not intractable 06/06/2017   Mild intermittent asthma with acute exacerbation 06/06/2017   Acute nonintractable headache 11/22/2016   Past Medical History:  Diagnosis Date   Allergy    Asthma    Hypertension    Past Surgical History:  Procedure Laterality Date   FINGER SURGERY     No Known Allergies Prior to Admission medications   Medication Sig Start Date End Date Taking? Authorizing Provider  Albuterol Sulfate (PROAIR RESPICLICK) 108 (90 Base) MCG/ACT AEPB Inhale 1-2 puffs into the lungs every 4 (four) hours as needed. 02/03/20  Yes Shade Flood, MD  busPIRone (BUSPAR) 5 MG tablet Take 1 tablet by mouth twice daily Patient not taking: Reported on 11/16/2020 05/17/20   Shade Flood, MD   Social History   Socioeconomic History  Marital status: Significant Other    Spouse name: Not on file   Number of children: 1   Years of education: Not on file   Highest education level: Not on file  Occupational History   Not on file  Tobacco Use   Smoking status: Former    Types: Cigarettes   Smokeless tobacco: Former  Building services engineer Use: Every day  Substance and Sexual Activity   Alcohol use: Yes    Alcohol/week: 0.0 standard drinks    Comment: occ 3 times a year   Drug use: No   Sexual activity: Yes  Other Topics Concern   Not on file  Social History Narrative   Lives at home with his significant other   Caffeine: 1-2 cups daily   Social Determinants of Health   Financial Resource Strain: Not on file  Food Insecurity: Not on file  Transportation Needs: Not on file  Physical Activity: Not on file   Stress: Not on file  Social Connections: Not on file  Intimate Partner Violence: Not on file    Review of Systems Per HPI.   Objective:   Vitals:   11/16/20 1026  BP: 124/76  Pulse: (!) 101  Resp: 17  Temp: 98 F (36.7 C)  TempSrc: Temporal  SpO2: 96%  Weight: 288 lb 9.6 oz (130.9 kg)  Height:  (1.803 m)     Physical Exam Vitals reviewed.  Constitutional:      Appearance: He is well-developed.  HENT:     Head: Normocephalic and atraumatic.  Neck:     Vascular: No carotid bruit or JVD.     Comments: No thyromegaly/nodule palpated.  Cardiovascular:     Rate and Rhythm: Normal rate and regular rhythm.     Heart sounds: Normal heart sounds. No murmur heard. Pulmonary:     Effort: Pulmonary effort is normal.     Breath sounds: Normal breath sounds. No rales.  Musculoskeletal:     Right lower leg: No edema.     Left lower leg: No edema.  Skin:    General: Skin is warm and dry.  Neurological:     Mental Status: He is alert and oriented to person, place, and time.  Psychiatric:        Mood and Affect: Mood normal.        Behavior: Behavior normal.        Thought Content: Thought content normal.       Assessment & Plan:  Antonio Kelley is a 30 y.o. male . Depression with anxiety - Plan: sertraline (ZOLOFT) 50 MG tablet, TSH  -Decreased control, did not tolerate buspirone.  Start Zoloft 50 mg daily, option of doses changes if not tolerated or insufficient treatment of symptoms, plan for recheck in the next 4 to 6 weeks.  Check TSH.  RTC precautions if worsening.  Declines counseling at this time.  Mild intermittent asthma with acute exacerbation - Plan: Albuterol Sulfate (PROAIR RESPICLICK) 108 (90 Base) MCG/ACT AEPB  -Overall stable, continue albuterol for breakthrough symptoms.  Option of inhaled corticosteroid if increased use/need.  Fatigue, unspecified type - Plan: TSH, Basic metabolic panel, CBC  -Possibly related to the depression, decreased  activity.  Check TSH, BMP, CBC.  Start SSRI as above, RTC precautions if persistent or new/worsening symptoms.   Meds ordered this encounter  Medications   Albuterol Sulfate (PROAIR RESPICLICK) 108 (90 Base) MCG/ACT AEPB    Sig: Inhale 1-2 puffs into the lungs every 4 (four)  hours as needed.    Dispense:  1 each    Refill:  0    May change to albuterol inhaler 1-2 puffs inhaled every 4-6 hours as needed if less costly than Respiclick   sertraline (ZOLOFT) 50 MG tablet    Sig: Take 1 tablet (50 mg total) by mouth daily.    Dispense:  30 tablet    Refill:  3   Patient Instructions  Ok to use albuterol as needed for now. If you require that med more frequently - can look at a different med to use daily as prevention med.   Try starting zoloft for anxiety and depression. Walking for exercise may also help.  Recheck in 4-6 weeks. Let me know if there are concerns/questions sooner.   Return to the clinic or go to the nearest emergency room if any of your symptoms worsen or new symptoms occur.   Managing Anxiety, Adult After being diagnosed with an anxiety disorder, you may be relieved to know why you have felt or behaved a certain way. You may also feel overwhelmed about the treatment ahead and what it will mean for your life. With care and support, you can manage this condition and recover from it. How to manage lifestyle changes Managing stress and anxiety Stress is your body's reaction to life changes and events, both good and bad. Most stress will last just a few hours, but stress can be ongoing and can lead to more than just stress. Although stress can play a major role in anxiety, it is not the same as anxiety. Stress is usually caused by something external, such as a deadline, test, or competition. Stress normally passes after the triggering event has ended.  Anxiety is caused by something internal, such as imagining a terrible outcome or worrying that something will go wrong that will  devastate you. Anxiety often does not go away even after the triggering event is over, and it can become long-term (chronic) worry. It is important to understand the differences between stress and anxiety and to manage your stress effectively so that it does not lead to an anxious response. Talk with your health care provider or a counselor to learn more about reducing anxiety and stress. He or she may suggest tension reduction techniques, such as: Music therapy. This can include creating or listening to music that you enjoy and that inspires you. Mindfulness-based meditation. This involves being aware of your normal breaths while not trying to control your breathing. It can be done while sitting or walking. Centering prayer. This involves focusing on a word, phrase, or sacred image that means something to you and brings you peace. Deep breathing. To do this, expand your stomach and inhale slowly through your nose. Hold your breath for 3-5 seconds. Then exhale slowly, letting your stomach muscles relax. Self-talk. This involves identifying thought patterns that lead to anxiety reactions and changing those patterns. Muscle relaxation. This involves tensing muscles and then relaxing them. Choose a tension reduction technique that suits your lifestyle and personality. These techniques take time and practice. Set aside 5-15 minutes a day to do them. Therapists can offer counseling and training in these techniques. The training to help with anxiety may be covered by some insurance plans. Other things you can do to manage stress and anxiety include: Keeping a stress/anxiety diary. This can help you learn what triggers your reaction and then learn ways to manage your response. Thinking about how you react to certain situations. You may not  be able to control everything, but you can control your response. Making time for activities that help you relax and not feeling guilty about spending your time in this  way. Visual imagery and yoga can help you stay calm and relax.  Medicines Medicines can help ease symptoms. Medicines for anxiety include: Anti-anxiety drugs. Antidepressants. Medicines are often used as a primary treatment for anxiety disorder. Medicines will be prescribed by a health care provider. When used together, medicines, psychotherapy, and tension reduction techniques may be the most effective treatment. Relationships Relationships can play a big part in helping you recover. Try to spend more time connecting with trusted friends and family members. Consider going to couples counseling, taking family education classes, or going to family therapy. Therapy can help you and others better understand your condition. How to recognize changes in your anxiety Everyone responds differently to treatment for anxiety. Recovery from anxiety happens when symptoms decrease and stop interfering with your daily activities at home or work. This may mean that you will start to: Have better concentration and focus. Worry will interfere less in your daily thinking. Sleep better. Be less irritable. Have more energy. Have improved memory. It is important to recognize when your condition is getting worse. Contact your health care provider if your symptoms interfere with home or work and you feel like your condition is not improving. Follow these instructions at home: Activity Exercise. Most adults should do the following: Exercise for at least 150 minutes each week. The exercise should increase your heart rate and make you sweat (moderate-intensity exercise). Strengthening exercises at least twice a week. Get the right amount and quality of sleep. Most adults need 7-9 hours of sleep each night. Lifestyle  Eat a healthy diet that includes plenty of vegetables, fruits, whole grains, low-fat dairy products, and lean protein. Do not eat a lot of foods that are high in solid fats, added sugars, or salt. Make  choices that simplify your life. Do not use any products that contain nicotine or tobacco, such as cigarettes, e-cigarettes, and chewing tobacco. If you need help quitting, ask your health care provider. Avoid caffeine, alcohol, and certain over-the-counter cold medicines. These may make you feel worse. Ask your pharmacist which medicines to avoid. General instructions Take over-the-counter and prescription medicines only as told by your health care provider. Keep all follow-up visits as told by your health care provider. This is important. Where to find support You can get help and support from these sources: Self-help groups. Online and [employer]. A trusted spiritual leader. Couples counseling. Family education classes. Family therapy. Where to find more information You may find that joining a support group helps you deal with your anxiety. The following sources can help you locate counselors or support groups near you: Mental Health America: www.mentalhealthamerica.net Anxiety and Depression Association of [place] (ADAA): ProgramCam.de [employer] on Mental Illness (NAMI): www.nami.org Contact a health care provider if you: Have a hard time staying focused or finishing daily tasks. Spend many hours a day feeling worried about everyday life. Become exhausted by worry. Start to have headaches, feel tense, or have nausea. Urinate more than normal. Have diarrhea. Get help right away if you have: A racing heart and shortness of breath. Thoughts of hurting yourself or others. If you ever feel like you may hurt yourself or others, or have thoughts about taking your own life, get help right away. You can go to your nearest emergency department or call: Your local emergency services (911 in  the [place] A suicide crisis helpline, such as the National Suicide Prevention Lifeline at 336-637-6067. This is open 24 hours a day. Summary Taking steps to learn and use tension  reduction techniques can help calm you and help prevent triggering an anxiety reaction. When used together, medicines, psychotherapy, and tension reduction techniques may be the most effective treatment. Family, friends, and partners can play a big part in helping you recover from an anxiety disorder. This information is not intended to replace advice given to you by your health care provider. Make sure you discuss any questions you have with your health care provider. Document Revised: 08/04/2018 Document Reviewed: 08/04/2018 Elsevier Patient Education  2022 Elsevier Inc.    Asthma Attack Prevention, Adult Although you may not be able to control the fact that you have asthma, you can take actions to prevent episodes of asthma (asthma attacks). How can this condition affect me? Asthma attacks (flare ups) can cause trouble breathing, wheezing, and coughing. They may keep you from doing activities you like to do. What can increase my risk? Coming into contact with things that cause asthma symptoms (asthma triggers) can put you at risk for an asthma attack. Common asthma triggers include: Things you are allergic to (allergens), such as: Dust mite and cockroach droppings. Pet dander. Mold. Pollen from trees and grasses. Food allergies. This might be a specific food or added chemicals called sulfites. Irritants, such as: Weather changes including very cold, dry, or humid air. Smoke. This includes campfire smoke, air pollution, and tobacco smoke. Strong odors from aerosol sprays and fumes from perfume, candles, and household cleaners. Other triggers, such as: Certain medicines. This includes NSAIDs, such as ibuprofen and aspirin. Viral respiratory infections (colds), including runny nose (rhinitis) or infection in the sinuses (sinusitis). Activity including exercise, laughing, or crying. Not using inhaled medicines (corticosteroids) as told. What actions can I take to prevent an asthma  attack? Stay healthy. Stay up to date on all immunizations as told by your health care provider, including the yearly flu (influenza) vaccine and pneumonia vaccine. Many asthma attacks can be prevented by carefully following your written asthma action plan. Follow your asthma action plan Work with your health care provider to create a written asthma action plan. This plan should include: A list of your asthma triggers and how to avoid or reduce them. A list of symptoms that you may have during an asthma attack. Information about which medicine to take, when to take the medicine, and how much of the medicine to take. Information to help you understand your peak flow measurements. Daily actions that you can take to prevent (control) your asthma symptoms. Contact information for your health care providers. If you have an asthma attack, act quickly. Follow the emergency steps on your written asthma action plan. This may prevent you from needing to go to the hospital. Monitor your asthma. To do this: Use your peak flow meter every morning and every evening for 2-3 weeks or as told by your health care provider. Record the results in a journal. A drop in your peak flow numbers on one or more days may mean that you are starting to have an asthma attack, even if you are not having symptoms. When you have asthma symptoms, write them down in a journal. Write down in your journal how often you need to use your fast-acting rescue inhaler. If you are using your rescue inhaler more often, it may mean that your asthma is not under control. Talk with  your health care provider about adjusting your asthma treatment plan to help you prevent future asthma attacks and gain better control of your condition.  Lifestyle Avoid or reduce contact with known outdoor allergens by staying indoors, keeping windows closed, and using air conditioning when pollen and mold counts are high. Do not use any products that contain  nicotine or tobacco, such as cigarettes, e-cigarettes, and chewing tobacco. If you need help quitting, ask your health care provider. If you are overweight, consider a weight-loss plan. Find ways to cope with stress and your feelings, such as mindfulness, relaxation, or breathing exercises. Ask your health care provider if a breathing exercise program (pulmonary rehabilitation) may be helpful to control symptoms and improve your quality of life. Medicines  Take over-the-counter and prescription medicines only as told by your health care provider. Do not stop taking your medicine and do not take less medicine even if you are doing well. Let your health care provider know: How often you use your rescue inhaler. How often you have symptoms when you are taking your regular medicines. If you wake up at night because of asthma symptoms. If you have more trouble with your breathing when you exercise. Activity Do your normal activities as told by your health care provider. Ask your health care provider what activities are safe for you. Some people have asthma symptoms or more asthma symptoms when they exercise. This is called exercise-induced bronchoconstriction (EIB). If you have this problem, talk with your health care provider about how to manage EIB. Some tips to follow include: Use your fast-acting inhaler before exercise. Exercise indoors if it is very cold, humid, or the pollen and mold counts are high. Warm up and cool down before and after exercise. Stop exercising right away if your asthma symptoms start or get worse. Where to find more information Asthma and Allergy Foundation of America: www.aafa.org Centers for Disease Control and Prevention: FootballExhibition.com.br American Lung Association: www.lung.org National Heart, Lung, and Blood Institute: PopSteam.is World Health Organization: https://castaneda-walker.com/ Get help right away if: You have followed your written asthma action plan and your symptoms  are not improving. Summary Asthma attacks (flare ups) can cause trouble breathing, wheezing, and coughing. They may keep you from doing activities you normally like to do. Work with your health care provider to create a written asthma action plan. Do not stop taking your medicine and do not take less medicine even if you are doing well. Do not use any products that contain nicotine or tobacco, such as cigarettes, e-cigarettes, and chewing tobacco. If you need help quitting, ask your health care provider. This information is not intended to replace advice given to you by your health care provider. Make sure you discuss any questions you have with your health care provider. Document Revised: 03/02/2019 Document Reviewed: 03/02/2019 Elsevier Patient Education  2022 Elsevier Inc.    Signed,   [name], MD [facility], [facility] 11/17/20 12:21 PM

## 2020-11-17 ENCOUNTER — Encounter: Payer: Self-pay | Admitting: Family Medicine

## 2020-11-22 NOTE — Progress Notes (Signed)
LVM 11/22/20 12:12 to schedule appt

## 2020-12-01 ENCOUNTER — Ambulatory Visit: Payer: No Typology Code available for payment source | Admitting: Registered Nurse

## 2020-12-13 ENCOUNTER — Ambulatory Visit (INDEPENDENT_AMBULATORY_CARE_PROVIDER_SITE_OTHER): Payer: No Typology Code available for payment source | Admitting: Family Medicine

## 2020-12-13 ENCOUNTER — Encounter: Payer: Self-pay | Admitting: Family Medicine

## 2020-12-13 ENCOUNTER — Other Ambulatory Visit: Payer: Self-pay

## 2020-12-13 VITALS — BP 128/80 | HR 78 | Temp 98.3°F | Resp 16 | Ht 71.0 in | Wt 289.6 lb

## 2020-12-13 DIAGNOSIS — R040 Epistaxis: Secondary | ICD-10-CM

## 2020-12-13 DIAGNOSIS — E119 Type 2 diabetes mellitus without complications: Secondary | ICD-10-CM | POA: Diagnosis not present

## 2020-12-13 DIAGNOSIS — R7309 Other abnormal glucose: Secondary | ICD-10-CM

## 2020-12-13 DIAGNOSIS — R739 Hyperglycemia, unspecified: Secondary | ICD-10-CM

## 2020-12-13 DIAGNOSIS — F418 Other specified anxiety disorders: Secondary | ICD-10-CM

## 2020-12-13 LAB — POCT GLYCOSYLATED HEMOGLOBIN (HGB A1C): Hemoglobin A1C: 7.6 % — AB (ref 4.0–5.6)

## 2020-12-13 LAB — GLUCOSE, POCT (MANUAL RESULT ENTRY): POC Glucose: 158 mg/dl — AB (ref 70–99)

## 2020-12-13 MED ORDER — BUPROPION HCL ER (XL) 150 MG PO TB24: 150.0000 mg | ORAL_TABLET | Freq: Every day | ORAL | 1 refills | Status: DC

## 2020-12-13 MED ORDER — METFORMIN HCL 500 MG PO TABS: 500.0000 mg | ORAL_TABLET | Freq: Every day | ORAL | 0 refills | Status: DC

## 2020-12-13 NOTE — Patient Instructions (Addendum)
Start metformin for diabetes. As we discussed it is early and not that high of a level. Change in diet/exercise and weight loss will likely reverse that diagnosis.   Blood tests looked ok last visit. If any return of nosebleeds let me know.  Try wellbutrin for depression. I expect that will be tolerated better. Follow up as planned in next 2 weeks - fasting labs at that time.  Return to the clinic or go to the nearest emergency room if any of your symptoms worsen or new symptoms occur.   Type 2 Diabetes Mellitus, Self-Care, Adult Caring for yourself after you have been diagnosed with type 2 diabetes (type 2 diabetes mellitus) means keeping your blood sugar (glucose) under control with a balance of: Nutrition. Exercise. Lifestyle changes. Medicines or insulin, if needed. Support from your team of health care providers and others. The following information explains what you need to know to manage your diabetes at home. What are the risks? Having diabetes can put you at risk for other long-term (chronic) conditions, such as heart disease and kidney disease. Your health care provider may prescribe medicines to help prevent complications from diabetes. How to monitor blood glucose  Check your blood glucose every day, or as often as told by your health care provider. Have your A1C (hemoglobin A1C) level checked two or more times a year, or as often as told by your health care provider. Your health care provider will set personalized treatment goals for you. Generally, the goal of treatment is to maintain the following blood glucose levels: Before meals: 80-130 mg/dL (4.4-7.2 mmol/L). After meals: below 180 mg/dL (10 mmol/L). A1C level: less than 7%. How to manage hyperglycemia and hypoglycemia Hyperglycemia symptoms Hyperglycemia, also called high blood glucose, occurs when blood glucose is too high. Make sure you know the early signs of hyperglycemia, such as: Increased  thirst. Hunger. Feeling very tired. Needing to urinate more often than usual. Blurry vision. Hypoglycemia symptoms Hypoglycemia, also called low blood glucose, occurs with a blood glucose level at or below 70 mg/dL (3.9 mmol/L). Diabetes medicines lower your blood glucose and can cause hypoglycemia. The risk for hypoglycemia increases during or after exercise, during sleep, during illness, and when skipping meals or not eating for a long time (fasting). It is important to know the symptoms of hypoglycemia and treat it right away. Always have a 15-gram rapid-acting carbohydrate snack with you to treat low blood glucose. Family members and close friends should also know the symptoms and understand how to treat hypoglycemia, in case you are not able to treat yourself. Symptoms may include: Hunger. Anxiety. Sweating and feeling clammy. Dizziness or feeling light-headed. Sleepiness. Increased heart rate. Irritability. Tingling or numbness around the mouth, lips, or tongue. Restless sleep. Severe hypoglycemia is when your blood glucose level is at or below 54 mg/dL (3 mmol/L). Severe hypoglycemia is an emergency. Do not wait to see if the symptoms will go away. Get medical help right away. Call your local emergency services (911 in the [place] Do not drive yourself to the hospital. If you have severe hypoglycemia and you cannot eat or drink, you may need glucagon. A family member or close friend should learn how to check your blood glucose and how to give you glucagon. Ask your health care provider if you need to have an emergency glucagon kit available. Follow these instructions at home: Medicines Take diabetes medicines as told by your health care provider. If your health care provider prescribed insulin or diabetes medicines,  take them every day. Do not run out of insulin or other diabetes medicines. Plan ahead so you always have these available. If you use insulin, adjust your dosage based on  your physical activity and what foods you eat. Your health care provider will tell you how to adjust your dosage. Take over-the-counter and prescription medicines only as told by your health care provider. Eating and drinking What you eat and drink affects your blood glucose and your insulin dosage. Making good choices helps to control your diabetes and prevent other health problems. A healthy meal plan includes eating lean proteins, complex carbohydrates, fresh fruits and vegetables, low-fat dairy products, and healthy fats. Make an appointment to see a registered dietitian to help you create an eating plan that is right for you. Make sure that you: Follow instructions from your health care provider about eating or drinking restrictions. Drink enough fluid to keep your urine pale yellow. Keep a record of the carbohydrates that you eat. Do this by reading food labels and learning the standard serving sizes of foods. Follow your sick-day plan whenever you cannot eat or drink as usual. Make this plan in advance with your health care provider.  Activity Stay active. Exercise regularly, as told by your health care provider. This may include: Stretching and doing strength exercises, such as yoga or weight lifting, 2 or more times a week. Doing 150 minutes or more of moderate-intensity or vigorous-intensity exercise each week. This could be brisk walking, biking, or water aerobics. Spread out your activity over 3 or more days of the week. Do not go more than 2 days in a row without doing some kind of physical activity. When you start a new exercise or activity, work with your health care provider to adjust your insulin, medicines, or food intake as needed. Lifestyle Do not use any products that contain nicotine or tobacco, such as cigarettes, e-cigarettes, and chewing tobacco. If you need help quitting, ask your health care provider. If your health care provider says that alcohol is safe for you, limit  how much you use to no more than 1 drink a day for women who are not pregnant and 2 drinks a day for men. In the [place] one drink equals one 12 oz bottle of beer (355 mL), one 5 oz glass of wine (148 mL), or one 1 oz glass of hard liquor (44 mL). Learn to manage stress. If you need help with this, ask your health care provider. Take care of your body  Keep your immunizations up to date. In addition to getting vaccinations as told by your health care provider, it is recommended that you get vaccinated against the following illnesses: The flu (influenza). Get a flu shot every year. Pneumonia. Hepatitis B. Schedule an eye exam soon after your diagnosis, and then one time every year after that. Check your skin and feet every day for cuts, bruises, redness, blisters, or sores. Schedule a foot exam with your health care provider once every year. Brush your teeth and gums two times a day, and floss one or more times a day. Visit your dentist one or more times every 6 months. Maintain a healthy weight. General instructions Share your diabetes management plan with people in your workplace, school, and household. Carry a medical alert card or wear medical alert jewelry. Keep all follow-up visits as told by your health care provider. This is important. Questions to ask your health care provider Should I meet with a certified diabetes care and  education specialist? Where can I find a support group for people with diabetes? Where to find more information American Diabetes Association (ADA): www.diabetes.org American Association of Diabetes Care and Education Specialists (ADCES): www.diabeteseducator.org International Diabetes Federation (IDF): MemberVerification.ca Summary Caring for yourself after you have been diagnosed with type 2 diabetes (type 2 diabetes mellitus) means keeping your blood sugar (glucose) under control with a balance of nutrition, exercise, lifestyle changes, and medicine. Check your blood  glucose every day, as often as told by your health care provider. Having diabetes can put you at risk for other long-term (chronic) conditions, such as heart disease and kidney disease. Your health care provider may prescribe medicines to help prevent complications from diabetes. Share your diabetes management plan with people in your workplace, school, and household. Keep all follow-up visits as told by your health care provider. This is important. This information is not intended to replace advice given to you by your health care provider. Make sure you discuss any questions you have with your health care provider. Document Revised: 02/16/2020 Document Reviewed: 10/06/2019 Elsevier Patient Education  [place].

## 2020-12-13 NOTE — Progress Notes (Signed)
Subjective:  Patient ID: Antonio Kelley, male    DOB: June 01, 1990  Age: 30 y.o. MRN: [id number]  CC:  Chief Complaint  Patient presents with   Depression    Pt reports stopped zoloft due to nose bleeds and inability to ejaculate during intercourse, took apx 2 weeks then stopped.    Hyperglycemia    Pt had high glucose on last labs and is here for recheck today of that lab, pt has not had anything to eat or drink this morning     HPI Antonio Kelley presents for   Depression: With anxiety, discussed at his September 1 visit.  Did not tolerate buspirone last year.  Increased anxiety past few months.  Was not having any regular exercise but in part due to fatigue.  Hyperglycemia noted as below. Started on sertraline last visit, but stopped after 2 weeks of use due to difficulty with sexual side effects, inability to achieve ejaculation as well as reported nosebleeds - 2 day period had 4-5 nosebleeds, then resolved. None since stopping. No prior use of Wellbutrin, no history of seizures. New child at home past week- born 9/11 - daughter [place]. Doing ok.   Depression screen [facility] 2/9 12/13/2020 11/16/2020 05/19/2020 02/03/2020 04/23/2018  Decreased Interest 1 2 0 0 0  Down, Depressed, Hopeless 1 1 0 0 0  PHQ - 2 Score 2 3 0 0 0  Altered sleeping 2 3 - - -  Tired, decreased energy 2 3 - - -  Change in appetite 0 0 - - -  Feeling bad or failure about yourself  1 0 - - -  Trouble concentrating 0 0 - - -  Moving slowly or fidgety/restless 0 1 - - -  Suicidal thoughts 0 0 - - -  PHQ-9 Score 7 10 - - -    Fatigue, hyperglycemia Glucose 223 on September 1.  Previous glucose was 93 in November 2021.  Last A1c 5.5 in September 2018 (weight was 266 at that time) recent bicarb normal at 23, on 11/16/20.  No known history of diabetes.no n/v/abd pain.    Wt Readings from Last 3 Encounters:  12/13/20 289 lb 9.6 oz (131.4 kg)  11/16/20 288 lb 9.6 oz (130.9 kg)  05/19/20 286 lb 12.8 oz (130.1 kg)      History Patient Active Problem List   Diagnosis Date Noted   Migraine without aura and without status migrainosus, not intractable 06/06/2017   Mild intermittent asthma with acute exacerbation 06/06/2017   Acute nonintractable headache 11/22/2016   Past Medical History:  Diagnosis Date   Allergy    Asthma    Hypertension    Past Surgical History:  Procedure Laterality Date   FINGER SURGERY     No Known Allergies Prior to Admission medications   Medication Sig Start Date End Date Taking? Authorizing Provider  Albuterol Sulfate (PROAIR RESPICLICK) 859 (90 Base) MCG/ACT AEPB Inhale 1-2 puffs into the lungs every 4 (four) hours as needed. 11/16/20  Yes Wendie Agreste, MD  busPIRone (BUSPAR) 5 MG tablet Take 1 tablet by mouth twice daily Patient not taking: No sig reported 05/17/20   Wendie Agreste, MD  sertraline (ZOLOFT) 50 MG tablet Take 1 tablet (50 mg total) by mouth daily. Patient not taking: Reported on 12/13/2020 11/16/20   Wendie Agreste, MD   Social History   Socioeconomic History   Marital status: Significant Other    Spouse name: Not on file   Number of children: 1  Years of education: Not on file   Highest education level: Not on file  Occupational History   Not on file  Tobacco Use   Smoking status: Former    Types: Cigarettes   Smokeless tobacco: Former  Scientific laboratory technician Use: Every day  Substance and Sexual Activity   Alcohol use: Yes    Alcohol/week: 0.0 standard drinks    Comment: occ 3 times a year   Drug use: No   Sexual activity: Yes  Other Topics Concern   Not on file  Social History Narrative   Lives at home with his significant other   Caffeine: 1-2 cups daily   Social Determinants of Health   Financial Resource Strain: Not on file  Food Insecurity: Not on file  Transportation Needs: Not on file  Physical Activity: Not on file  Stress: Not on file  Social Connections: Not on file  Intimate Partner Violence: Not on file     Review of Systems Per HPI.   Objective:   Vitals:   12/13/20 1128  BP: 128/80  Pulse: 78  Resp: 16  Temp: 98.3 F (36.8 C)  TempSrc: Temporal  SpO2: 97%  Weight: 289 lb 9.6 oz (131.4 kg)  Height: _0  (1.803 m)     Physical Exam Vitals reviewed.  Constitutional:      Appearance: He is well-developed. He is obese.  HENT:     Head: Normocephalic and atraumatic.  Neck:     Vascular: No carotid bruit or JVD.  Cardiovascular:     Rate and Rhythm: Normal rate and regular rhythm.     Heart sounds: Normal heart sounds. No murmur heard. Pulmonary:     Effort: Pulmonary effort is normal.     Breath sounds: Normal breath sounds. No rales.  Musculoskeletal:     Right lower leg: No edema.     Left lower leg: No edema.  Skin:    General: Skin is warm and dry.  Neurological:     Mental Status: He is alert and oriented to person, place, and time.  Psychiatric:        Mood and Affect: Mood normal.        Behavior: Behavior normal.        Thought Content: Thought content normal.      Results for orders placed or performed in visit on 12/13/20  POCT glucose (manual entry)  Result Value Ref Range   POC Glucose 158 (A) 70 - 99 mg/dl  POCT glycosylated hemoglobin (Hb A1C)  Result Value Ref Range   Hemoglobin A1C 7.6 (A) 4.0 - 5.6 %   HbA1c POC (<> result, manual entry)     HbA1c, POC (prediabetic range)     HbA1c, POC (controlled diabetic range)    36 minutes spent during visit, including chart review, lab ansd prior record review, counseling and assimilation of information, exam, discussion of plan, and chart completion.    Assessment & Plan:  Antonio Kelley is a 30 y.o. male . Hyperglycemia - Plan: POCT glucose (manual entry), POCT glycosylated hemoglobin (Hb A1C) Diabetes mellitus, new onset ([place]) - Plan: metFORMIN (GLUCOPHAGE) 500 MG tablet  -New diagnosis of diabetes, likely related to weight gain over the past few years, less active with staying at home.   Likely will be reversible with weight changes, lifestyle modification but for now we will start metformin 500 mg daily, potential side effects discussed.  Has follow-up next few weeks and can check fasting labs, other diabetes  health maintenance discussion at that time, handout given on diabetes and self-care.  Depression with anxiety - Plan: buPROPion (WELLBUTRIN XL) 150 MG 24 hr tablet Epistaxis.   -Intolerant to buspirone, zoloft. Trial of wellbutrin, rtc precautions if return of epistaxis. Prior platelets normal.   Meds ordered this encounter  Medications   metFORMIN (GLUCOPHAGE) 500 MG tablet    Sig: Take 1 tablet (500 mg total) by mouth daily with breakfast.    Dispense:  90 tablet    Refill:  0   buPROPion (WELLBUTRIN XL) 150 MG 24 hr tablet    Sig: Take 1 tablet (150 mg total) by mouth daily.    Dispense:  30 tablet    Refill:  1   Patient Instructions  Start metformin for diabetes. As we discussed it is early and not that high of a level. Change in diet/exercise and weight loss will likely reverse that diagnosis.   Blood tests looked ok last visit. If any return of nosebleeds let me know.  Try wellbutrin for depression. I expect that will be tolerated better. Follow up as planned in next 2 weeks - fasting labs at that time.  Return to the clinic or go to the nearest emergency room if any of your symptoms worsen or new symptoms occur.   Type 2 Diabetes Mellitus, Self-Care, Adult Caring for yourself after you have been diagnosed with type 2 diabetes (type 2 diabetes mellitus) means keeping your blood sugar (glucose) under control with a balance of: Nutrition. Exercise. Lifestyle changes. Medicines or insulin, if needed. Support from your team of health care providers and others. The following information explains what you need to know to manage your diabetes at home. What are the risks? Having diabetes can put you at risk for other long-term (chronic) conditions, such as heart  disease and kidney disease. Your health care provider may prescribe medicines to help prevent complications from diabetes. How to monitor blood glucose  Check your blood glucose every day, or as often as told by your health care provider. Have your A1C (hemoglobin A1C) level checked two or more times a year, or as often as told by your health care provider. Your health care provider will set personalized treatment goals for you. Generally, the goal of treatment is to maintain the following blood glucose levels: Before meals: 80-130 mg/dL (4.4-7.2 mmol/L). After meals: below 180 mg/dL (10 mmol/L). A1C level: less than 7%. How to manage hyperglycemia and hypoglycemia Hyperglycemia symptoms Hyperglycemia, also called high blood glucose, occurs when blood glucose is too high. Make sure you know the early signs of hyperglycemia, such as: Increased thirst. Hunger. Feeling very tired. Needing to urinate more often than usual. Blurry vision. Hypoglycemia symptoms Hypoglycemia, also called low blood glucose, occurs with a blood glucose level at or below 70 mg/dL (3.9 mmol/L). Diabetes medicines lower your blood glucose and can cause hypoglycemia. The risk for hypoglycemia increases during or after exercise, during sleep, during illness, and when skipping meals or not eating for a long time (fasting). It is important to know the symptoms of hypoglycemia and treat it right away. Always have a 15-gram rapid-acting carbohydrate snack with you to treat low blood glucose. Family members and close friends should also know the symptoms and understand how to treat hypoglycemia, in case you are not able to treat yourself. Symptoms may include: Hunger. Anxiety. Sweating and feeling clammy. Dizziness or feeling light-headed. Sleepiness. Increased heart rate. Irritability. Tingling or numbness around the mouth, lips, or tongue. Restless sleep.  Severe hypoglycemia is when your blood glucose level is at or  below 54 mg/dL (3 mmol/L). Severe hypoglycemia is an emergency. Do not wait to see if the symptoms will go away. Get medical help right away. Call your local emergency services (911 in the [place] Do not drive yourself to the hospital. If you have severe hypoglycemia and you cannot eat or drink, you may need glucagon. A family member or close friend should learn how to check your blood glucose and how to give you glucagon. Ask your health care provider if you need to have an emergency glucagon kit available. Follow these instructions at home: Medicines Take diabetes medicines as told by your health care provider. If your health care provider prescribed insulin or diabetes medicines, take them every day. Do not run out of insulin or other diabetes medicines. Plan ahead so you always have these available. If you use insulin, adjust your dosage based on your physical activity and what foods you eat. Your health care provider will tell you how to adjust your dosage. Take over-the-counter and prescription medicines only as told by your health care provider. Eating and drinking What you eat and drink affects your blood glucose and your insulin dosage. Making good choices helps to control your diabetes and prevent other health problems. A healthy meal plan includes eating lean proteins, complex carbohydrates, fresh fruits and vegetables, low-fat dairy products, and healthy fats. Make an appointment to see a registered dietitian to help you create an eating plan that is right for you. Make sure that you: Follow instructions from your health care provider about eating or drinking restrictions. Drink enough fluid to keep your urine pale yellow. Keep a record of the carbohydrates that you eat. Do this by reading food labels and learning the standard serving sizes of foods. Follow your sick-day plan whenever you cannot eat or drink as usual. Make this plan in advance with your health care  provider.  Activity Stay active. Exercise regularly, as told by your health care provider. This may include: Stretching and doing strength exercises, such as yoga or weight lifting, 2 or more times a week. Doing 150 minutes or more of moderate-intensity or vigorous-intensity exercise each week. This could be brisk walking, biking, or water aerobics. Spread out your activity over 3 or more days of the week. Do not go more than 2 days in a row without doing some kind of physical activity. When you start a new exercise or activity, work with your health care provider to adjust your insulin, medicines, or food intake as needed. Lifestyle Do not use any products that contain nicotine or tobacco, such as cigarettes, e-cigarettes, and chewing tobacco. If you need help quitting, ask your health care provider. If your health care provider says that alcohol is safe for you, limit how much you use to no more than 1 drink a day for women who are not pregnant and 2 drinks a day for men. In the [place] one drink equals one 12 oz bottle of beer (355 mL), one 5 oz glass of wine (148 mL), or one 1 oz glass of hard liquor (44 mL). Learn to manage stress. If you need help with this, ask your health care provider. Take care of your body  Keep your immunizations up to date. In addition to getting vaccinations as told by your health care provider, it is recommended that you get vaccinated against the following illnesses: The flu (influenza). Get a flu shot every year. Pneumonia. Hepatitis  B. Schedule an eye exam soon after your diagnosis, and then one time every year after that. Check your skin and feet every day for cuts, bruises, redness, blisters, or sores. Schedule a foot exam with your health care provider once every year. Brush your teeth and gums two times a day, and floss one or more times a day. Visit your dentist one or more times every 6 months. Maintain a healthy weight. General instructions Share your  diabetes management plan with people in your workplace, school, and household. Carry a medical alert card or wear medical alert jewelry. Keep all follow-up visits as told by your health care provider. This is important. Questions to ask your health care provider Should I meet with a certified diabetes care and education specialist? Where can I find a support group for people with diabetes? Where to find more information American Diabetes Association (ADA): www.diabetes.org American Association of Diabetes Care and Education Specialists (ADCES): www.diabeteseducator.org International Diabetes Federation (IDF): MemberVerification.ca Summary Caring for yourself after you have been diagnosed with type 2 diabetes (type 2 diabetes mellitus) means keeping your blood sugar (glucose) under control with a balance of nutrition, exercise, lifestyle changes, and medicine. Check your blood glucose every day, as often as told by your health care provider. Having diabetes can put you at risk for other long-term (chronic) conditions, such as heart disease and kidney disease. Your health care provider may prescribe medicines to help prevent complications from diabetes. Share your diabetes management plan with people in your workplace, school, and household. Keep all follow-up visits as told by your health care provider. This is important. This information is not intended to replace advice given to you by your health care provider. Make sure you discuss any questions you have with your health care provider. Document Revised: 02/16/2020 Document Reviewed: 10/06/2019 Elsevier Patient Education  2022 [place],   Merri Ray, MD [facility] 12/13/20 12:10 PM

## 2020-12-25 ENCOUNTER — Encounter: Payer: Self-pay | Admitting: Family Medicine

## 2020-12-25 ENCOUNTER — Other Ambulatory Visit: Payer: Self-pay

## 2020-12-25 ENCOUNTER — Ambulatory Visit (INDEPENDENT_AMBULATORY_CARE_PROVIDER_SITE_OTHER): Payer: No Typology Code available for payment source | Admitting: Family Medicine

## 2020-12-25 VITALS — BP 126/72 | HR 85 | Temp 98.1°F | Resp 16 | Ht 71.0 in | Wt 289.8 lb

## 2020-12-25 DIAGNOSIS — E119 Type 2 diabetes mellitus without complications: Secondary | ICD-10-CM | POA: Diagnosis not present

## 2020-12-25 DIAGNOSIS — Z13220 Encounter for screening for lipoid disorders: Secondary | ICD-10-CM

## 2020-12-25 DIAGNOSIS — Z0000 Encounter for general adult medical examination without abnormal findings: Secondary | ICD-10-CM | POA: Diagnosis not present

## 2020-12-25 DIAGNOSIS — F418 Other specified anxiety disorders: Secondary | ICD-10-CM

## 2020-12-25 DIAGNOSIS — Z130 Encounter for screening for diseases of the blood and blood-forming organs and certain disorders involving the immune mechanism: Secondary | ICD-10-CM

## 2020-12-25 MED ORDER — METFORMIN HCL 500 MG PO TABS: 500.0000 mg | ORAL_TABLET | Freq: Every day | ORAL | 1 refills | Status: DC

## 2020-12-25 MED ORDER — BUPROPION HCL ER (XL) 150 MG PO TB24: 150.0000 mg | ORAL_TABLET | Freq: Every day | ORAL | 1 refills | Status: DC

## 2020-12-25 NOTE — Progress Notes (Signed)
Subjective:  Patient ID: Antonio Kelley, male    DOB: [birth year]  Age: 30 y.o. MRN: [id number]  CC:  Chief Complaint  Patient presents with   Annual Exam    Pt here for yearly exam, no concerns feels well.     HPI Antonio Kelley presents for  Annual physical exam.  Diabetes: New diagnosis of diabetes in September, started on metformin.  Thought to be related to weight gain over the years, discussed likely reversibility with weight changes/lifestyle modifications. Tolerating metformin 500mg  qd.  Home reading 128.  Some increased exercise - 10 min  Microalbumin: Plan for testing today. Optho, foot exam, pneumovax:   plans to schedule appt.   Lab Results  Component Value Date   HGBA1C 7.6 (A) 12/13/2020   HGBA1C 5.5 11/22/2016   HGBA1C 5.4 12/28/2014   Lab Results  Component Value Date   CREATININE 1.17 11/16/2020   Depression with anxiety: Discussed at his recent visit September 20, intolerant to buspirone, Zoloft, started on Wellbutrin 9/28.  Feeling ok on wellbutrin - more energy, no new side effects.   Depression screen [facility] 2/9 12/25/2020 12/13/2020 11/16/2020 05/19/2020 02/03/2020  Decreased Interest 1 1 2  0 0  Down, Depressed, Hopeless 1 1 1  0 0  PHQ - 2 Score 2 2 3  0 0  Altered sleeping 2 2 3  - -  Tired, decreased energy 1 2 3  - -  Change in appetite 1 0 0 - -  Feeling bad or failure about yourself  1 1 0 - -  Trouble concentrating 2 0 0 - -  Moving slowly or fidgety/restless 1 0 1 - -  Suicidal thoughts 0 0 0 - -  PHQ-9 Score 10 7 10  - -    Cancer screening No known family history of cancers other than mom, grandmother, aunt with breast CA. No FH of prostate CA.   Immunization History  Administered Date(s) Administered   Tdap 11/22/2016  Vaccine, COVID-vaccine declined.  Pneumovax discussed, declined for now as well.   Vision Screening   Right eye Left eye Both eyes  Without correction 20/15 20/15 20/15   With correction     Will be establishing with  optho.   Dentist - none.plans on next year with insurance.   Alcohol: none  Nicotine - quit cigarettes  8-10 years ago, vaping -daily - all day. Has tried cutting back. Difficult to quit. No THC.   Exercise - has started biking for exercise.  Declines STI screening.   History Patient Active Problem List   Diagnosis Date Noted   Migraine without aura and without status migrainosus, not intractable 06/06/2017   Mild intermittent asthma with acute exacerbation 06/06/2017   Acute nonintractable headache 11/22/2016   Past Medical History:  Diagnosis Date   Allergy    Asthma    Hypertension    Past Surgical History:  Procedure Laterality Date   FINGER SURGERY     No Known Allergies Prior to Admission medications   Medication Sig Start Date End Date Taking? Authorizing Provider  Albuterol Sulfate (PROAIR RESPICLICK) 108 (90 Base) MCG/ACT AEPB Inhale 1-2 puffs into the lungs every 4 (four) hours as needed. 11/16/20  Yes , MD  buPROPion (WELLBUTRIN XL) 150 MG 24 hr tablet Take 1 tablet (150 mg total) by mouth daily. 12/13/20  Yes 01/22/2017, MD  metFORMIN (GLUCOPHAGE) 500 MG tablet Take 1 tablet (500 mg total) by mouth daily with breakfast. 12/13/20  Yes 06/08/2017, MD  Social History   Socioeconomic History   Marital status: Significant Other    Spouse name: Not on file   Number of children: 1   Years of education: Not on file   Highest education level: Not on file  Occupational History   Not on file  Tobacco Use   Smoking status: Former    Types: Cigarettes   Smokeless tobacco: Former  Building services engineer Use: Every day  Substance and Sexual Activity   Alcohol use: Yes    Alcohol/week: 0.0 standard drinks    Comment: occ 3 times a year   Drug use: No   Sexual activity: Yes  Other Topics Concern   Not on file  Social History Narrative   Lives at home with his significant other   Caffeine: 1-2 cups daily   Social Determinants of  Health   Financial Resource Strain: Not on file  Food Insecurity: Not on file  Transportation Needs: Not on file  Physical Activity: Not on file  Stress: Not on file  Social Connections: Not on file  Intimate Partner Violence: Not on file    Review of Systems 13 point review of systems per patient health survey noted.  Negative other than as indicated above or in HPI.    Objective:   Vitals:   12/25/20 1418  BP: 126/72  Pulse: 85  Resp: 16  Temp: 98.1 F (36.7 C)  TempSrc: Temporal  SpO2: 97%  Weight: 289 lb 12.8 oz (131.5 kg)  Height: 5\' 11"  (1.803 m)     Physical Exam     Assessment & Plan:  Antonio Kelley is a 30 y.o. male . Diabetes mellitus, new onset (HCC) - Plan: Comprehensive metabolic panel, metFORMIN (GLUCOPHAGE) 500 MG tablet, Microalbumin / creatinine urine ratio, CANCELED: Hemoglobin A1c  -Tolerating metformin, continue same.  Check urine microalbumin.  Continue work on diet/exercise.  22-month follow-up.  Screening for deficiency anemia - Plan: CBC with Differential/Platelet  Screening for hyperlipidemia - Plan: Comprehensive metabolic panel, Lipid panel  Annual physical exam - Plan: CBC with Differential/Platelet, Comprehensive metabolic panel, Lipid panel, CANCELED: Hemoglobin A1c   --anticipatory guidance as below in AVS, screening labs above. Health maintenance items as above in HPI discussed/recommended as applicable.   Depression with anxiety - Plan: buPROPion (WELLBUTRIN XL) 150 MG 24 hr tablet  -Tolerating Wellbutrin, continue same, option of 300 mg dosing if insufficient control.  15-month follow-up  -Also discussed electronic cigarette/vape usage.  Cessation recommended, handout given, use of Wellbutrin may also be helpful  No orders of the defined types were placed in this encounter.  Patient Instructions  No change in metformin for now. Start low/go slow with exercise.  If depression/fatigue is not continuing to improve, can increase to 2  pills per day. Let me know before refills due what dose has worked best.   I do recommend establishing with dentist and aye specialist when able  for diabetic eye exam.   Preventive Care 51-65 Years Old, Male Preventive care refers to lifestyle choices and visits with your health care provider that can promote health and wellness. This includes: A yearly physical exam. This is also called an annual wellness visit. Regular dental and eye exams. Immunizations. Screening for certain conditions. Healthy lifestyle choices, such as: Eating a healthy diet. Getting regular exercise. Not using drugs or products that contain nicotine and tobacco. Limiting alcohol use. What can I expect for my preventive care visit? Physical exam Your health care provider may check your:  Height and weight. These may be used to calculate your BMI (body mass index). BMI is a measurement that tells if you are at a healthy weight. Heart rate and blood pressure. Body temperature. Skin for abnormal spots. Counseling Your health care provider may ask you questions about your: Past medical problems. Family's medical history. Alcohol, tobacco, and drug use. Emotional well-being. Home life and relationship well-being. Sexual activity. Diet, exercise, and sleep habits. Work and work Astronomer. Access to firearms. What immunizations do I need? Vaccines are usually given at various ages, according to a schedule. Your health care provider will recommend vaccines for you based on your age, medical history, and lifestyle or other factors, such as travel or where you work. What tests do I need? Blood tests Lipid and cholesterol levels. These may be checked every 5 years starting at age 40. Hepatitis C test. Hepatitis B test. Screening  Diabetes screening. This is done by checking your blood sugar (glucose) after you have not eaten for a while (fasting). Genital exam to check for testicular cancer or hernias. STD  (sexually transmitted disease) testing, if you are at risk. Talk with your health care provider about your test results, treatment options, and if necessary, the need for more tests. Follow these instructions at home: Eating and drinking  Eat a healthy diet that includes fresh fruits and vegetables, whole grains, lean protein, and low-fat dairy products. Drink enough fluid to keep your urine pale yellow. Take vitamin and mineral supplements as recommended by your health care provider. Do not drink alcohol if your health care provider tells you not to drink. If you drink alcohol: Limit how much you have to 0-2 drinks a day. Be aware of how much alcohol is in your drink. In the [place] one drink equals one 12 oz bottle of beer (355 mL), one 5 oz glass of wine (148 mL), or one 1 oz glass of hard liquor (44 mL). Lifestyle Take daily care of your teeth and gums. Brush your teeth every morning and night with fluoride toothpaste. Floss one time each day. Stay active. Exercise for at least 30 minutes 5 or more days each week. Do not use any products that contain nicotine or tobacco, such as cigarettes, e-cigarettes, and chewing tobacco. If you need help quitting, ask your health care provider. Do not use drugs. If you are sexually active, practice safe sex. Use a condom or other form of protection to prevent STIs (sexually transmitted infections). Find healthy ways to cope with stress, such as: Meditation, yoga, or listening to music. Journaling. Talking to a trusted person. Spending time with friends and family. Safety Always wear your seat belt while driving or riding in a vehicle. Do not drive: If you have been drinking alcohol. Do not ride with someone who has been drinking. When you are tired or distracted. While texting. Wear a helmet and other protective equipment during sports activities. If you have firearms in your house, make sure you follow all gun safety procedures. Seek help if  you have been physically or sexually abused. What's next? Go to your health care provider once a year for an annual wellness visit. Ask your health care provider how often you should have your eyes and teeth checked. Stay up to date on all vaccines. This information is not intended to replace advice given to you by your health care provider. Make sure you discuss any questions you have with your health care provider. Document Revised: 05/12/2020 Document Reviewed: 02/26/2018 Elsevier Patient  Education  2022 [employer].    Electronic Cigarette Information Electronic cigarettes, or e-cigarettes, are battery-operated devices that deliver nicotine--a very addictive drug--to the body. They come in many shapes, including in the shape of a cigarette, pipe, pen, and even a USB memory stick. E-cigarettes have a cartridge that contains a liquid form of nicotine. When a person uses the device, the liquid heats up. It then becomes a vapor. Inhaling this vapor is called vaping. Nicotine is thought to increase your risk for certain types of cancer. In addition to nicotine, e-cigarettes may contain other harmful and cancer-causing chemicals, including: Formaldehyde. Acetaldehyde. Heavy metals. Ultra-fine particles that can get inhaled deep into the lungs. Chemical colorings and flavorings. It is not clear how much nicotine you get when vaping, and it is hard to know what chemicals are in the vaping liquids. The health effects of vaping are not completely known, but you should be aware of the possible dangers of using these products. Some people may use e-cigarettes in order to quit smoking tobacco. However, this has not been proven to work, and the Education officer, environmental (FDA) has not approved e-cigarettes for this purpose. How can using electronic cigarettes affect me? You may be at risk for developing a dangerous lung disease. There are reports of an increasing number of cases involving serious  lung problems, and even death, associated with e-cigarette use. Your risk may be even higher if you: Buy e-cigarettes or vaping oils off the street. Add any substances to the e-cigarettes that are not intended by the manufacturer. Vaping may make you crave nicotine. Nicotine does the following: Changes your blood sugar levels. Increases your heart rate, blood pressure, and breathing rate. Increases your risk of developing blood clots (hypercoagulable state) and diabetes. Increases your risk of gum disease that may lead to losing teeth. If you smoke e-cigarettes, you may be more likely to start smoking or to smoke more tobacco cigarettes. Becoming addicted to nicotine may make your brain more sensitive to other addictive drugs. You may move to other addictive substances. You may be in danger of overdosing on nicotine. Nicotine poisoning can cause nausea, vomiting, seizures, and trouble breathing. An e-cigarette may explode and cause fires and burn injuries. If you are pregnant, the nicotine in e-cigarettes may be harmful to your baby. Nicotine can cause: Brain or lung problems for your baby. Your baby to be born too early. Your baby to be born with a low birth weight. Vaping has also been linked to decreases in memory and attention span in children and teens. What actions can I take to stop vaping? If you can, stop vaping on your own before you become addicted to nicotine. If you need help quitting, ask your health care provider. There are three effective ways to fight nicotine addiction: Nicotine replacement therapy. Using nicotine gum or a nicotine patch blocks your craving for nicotine. Over time, you can reduce the amount of nicotine you use until you can stop using nicotine completely without having cravings. Prescription medicines approved to fight nicotine addiction. These stop nicotine cravings or block the effects of nicotine. Behavioral therapy. This may include: A self-help smoking  cessation program. Individual or group therapy. A smoking cessation support group. There are several national programs to help you quit smoking or vaping. These include: Text message programs, such as SmokefreeTXT. Apps for mobile phones, including the free quitSTART app. Hotlines, such as 1-800-QUIT-NOW 904 403 4446). Where to find support You can get support at these sites: [place]  Department of Health and Human Services: https://smokefree.gov American Lung Association: www.lung.org Where to find more information Learn more about e-cigarettes from: [employer] on Drug Abuse: http://www.price-smith.com/ Centers for Disease Control and Prevention: FootballExhibition.com.br Summary E-cigarettes can cause nicotine addiction. E-cigarettes are not approved as a way to stop smoking. They are not a risk-free alternative to smoking tobacco. There are reports of an increasing number of cases involving serious lung problems, and even death, associated with e-cigarette use. If you can stop vaping on your own, do it before you become addicted to nicotine. If you need help quitting, ask your health care provider. There are various methods and programs that can help you stop smoking or vaping. This information is not intended to replace advice given to you by your health care provider. Make sure you discuss any questions you have with your health care provider. Document Revised: 06/30/2018 Document Reviewed: 05/15/2018 Elsevier Patient Education  2022 [employer] Inc.     Signed,   [name], MD  Primary Care, [facility] 12/25/20 2:38 PM

## 2020-12-25 NOTE — Patient Instructions (Addendum)
No change in metformin for now. Start low/go slow with exercise.  If depression/fatigue is not continuing to improve, can increase to 2 pills per day. Let me know before refills due what dose has worked best.   I do recommend establishing with dentist and aye specialist when able  for diabetic eye exam.   Preventive Care 22-32 Years Old, Male Preventive care refers to lifestyle choices and visits with your health care provider that can promote health and wellness. This includes: A yearly physical exam. This is also called an annual wellness visit. Regular dental and eye exams. Immunizations. Screening for certain conditions. Healthy lifestyle choices, such as: Eating a healthy diet. Getting regular exercise. Not using drugs or products that contain nicotine and tobacco. Limiting alcohol use. What can I expect for my preventive care visit? Physical exam Your health care provider may check your: Height and weight. These may be used to calculate your BMI (body mass index). BMI is a measurement that tells if you are at a healthy weight. Heart rate and blood pressure. Body temperature. Skin for abnormal spots. Counseling Your health care provider may ask you questions about your: Past medical problems. Family's medical history. Alcohol, tobacco, and drug use. Emotional well-being. Home life and relationship well-being. Sexual activity. Diet, exercise, and sleep habits. Work and work Astronomer. Access to firearms. What immunizations do I need? Vaccines are usually given at various ages, according to a schedule. Your health care provider will recommend vaccines for you based on your age, medical history, and lifestyle or other factors, such as travel or where you work. What tests do I need? Blood tests Lipid and cholesterol levels. These may be checked every 5 years starting at age 29. Hepatitis C test. Hepatitis B test. Screening  Diabetes screening. This is done by checking  your blood sugar (glucose) after you have not eaten for a while (fasting). Genital exam to check for testicular cancer or hernias. STD (sexually transmitted disease) testing, if you are at risk. Talk with your health care provider about your test results, treatment options, and if necessary, the need for more tests. Follow these instructions at home: Eating and drinking  Eat a healthy diet that includes fresh fruits and vegetables, whole grains, lean protein, and low-fat dairy products. Drink enough fluid to keep your urine pale yellow. Take vitamin and mineral supplements as recommended by your health care provider. Do not drink alcohol if your health care provider tells you not to drink. If you drink alcohol: Limit how much you have to 0-2 drinks a day. Be aware of how much alcohol is in your drink. In the [place] one drink equals one 12 oz bottle of beer (355 mL), one 5 oz glass of wine (148 mL), or one 1 oz glass of hard liquor (44 mL). Lifestyle Take daily care of your teeth and gums. Brush your teeth every morning and night with fluoride toothpaste. Floss one time each day. Stay active. Exercise for at least 30 minutes 5 or more days each week. Do not use any products that contain nicotine or tobacco, such as cigarettes, e-cigarettes, and chewing tobacco. If you need help quitting, ask your health care provider. Do not use drugs. If you are sexually active, practice safe sex. Use a condom or other form of protection to prevent STIs (sexually transmitted infections). Find healthy ways to cope with stress, such as: Meditation, yoga, or listening to music. Journaling. Talking to a trusted person. Spending time with friends and family. Safety  Always wear your seat belt while driving or riding in a vehicle. Do not drive: If you have been drinking alcohol. Do not ride with someone who has been drinking. When you are tired or distracted. While texting. Wear a helmet and other protective  equipment during sports activities. If you have firearms in your house, make sure you follow all gun safety procedures. Seek help if you have been physically or sexually abused. What's next? Go to your health care provider once a year for an annual wellness visit. Ask your health care provider how often you should have your eyes and teeth checked. Stay up to date on all vaccines. This information is not intended to replace advice given to you by your health care provider. Make sure you discuss any questions you have with your health care provider. Document Revised: 05/12/2020 Document Reviewed: 02/26/2018 Elsevier Patient Education  2022 [employer].    Electronic Cigarette Information Electronic cigarettes, or e-cigarettes, are battery-operated devices that deliver nicotine--a very addictive drug--to the body. They come in many shapes, including in the shape of a cigarette, pipe, pen, and even a USB memory stick. E-cigarettes have a cartridge that contains a liquid form of nicotine. When a person uses the device, the liquid heats up. It then becomes a vapor. Inhaling this vapor is called vaping. Nicotine is thought to increase your risk for certain types of cancer. In addition to nicotine, e-cigarettes may contain other harmful and cancer-causing chemicals, including: Formaldehyde. Acetaldehyde. Heavy metals. Ultra-fine particles that can get inhaled deep into the lungs. Chemical colorings and flavorings. It is not clear how much nicotine you get when vaping, and it is hard to know what chemicals are in the vaping liquids. The health effects of vaping are not completely known, but you should be aware of the possible dangers of using these products. Some people may use e-cigarettes in order to quit smoking tobacco. However, this has not been proven to work, and the Education officer, environmental (FDA) has not approved e-cigarettes for this purpose. How can using electronic cigarettes affect  me? You may be at risk for developing a dangerous lung disease. There are reports of an increasing number of cases involving serious lung problems, and even death, associated with e-cigarette use. Your risk may be even higher if you: Buy e-cigarettes or vaping oils off the street. Add any substances to the e-cigarettes that are not intended by the manufacturer. Vaping may make you crave nicotine. Nicotine does the following: Changes your blood sugar levels. Increases your heart rate, blood pressure, and breathing rate. Increases your risk of developing blood clots (hypercoagulable state) and diabetes. Increases your risk of gum disease that may lead to losing teeth. If you smoke e-cigarettes, you may be more likely to start smoking or to smoke more tobacco cigarettes. Becoming addicted to nicotine may make your brain more sensitive to other addictive drugs. You may move to other addictive substances. You may be in danger of overdosing on nicotine. Nicotine poisoning can cause nausea, vomiting, seizures, and trouble breathing. An e-cigarette may explode and cause fires and burn injuries. If you are pregnant, the nicotine in e-cigarettes may be harmful to your baby. Nicotine can cause: Brain or lung problems for your baby. Your baby to be born too early. Your baby to be born with a low birth weight. Vaping has also been linked to decreases in memory and attention span in children and teens. What actions can I take to stop vaping? If you can,  stop vaping on your own before you become addicted to nicotine. If you need help quitting, ask your health care provider. There are three effective ways to fight nicotine addiction: Nicotine replacement therapy. Using nicotine gum or a nicotine patch blocks your craving for nicotine. Over time, you can reduce the amount of nicotine you use until you can stop using nicotine completely without having cravings. Prescription medicines approved to fight nicotine  addiction. These stop nicotine cravings or block the effects of nicotine. Behavioral therapy. This may include: A self-help smoking cessation program. Individual or group therapy. A smoking cessation support group. There are several national programs to help you quit smoking or vaping. These include: Text message programs, such as SmokefreeTXT. Apps for mobile phones, including the free quitSTART app. Hotlines, such as 1-800-QUIT-NOW 856-234-3018). Where to find support You can get support at these sites: [place].S. Department of Health and Human Services: https://smokefree.gov American Lung Association: www.lung.org Where to find more information Learn more about e-cigarettes from: [employer] on Drug Abuse: http://www.price-smith.com/ Centers for Disease Control and Prevention: FootballExhibition.com.br Summary E-cigarettes can cause nicotine addiction. E-cigarettes are not approved as a way to stop smoking. They are not a risk-free alternative to smoking tobacco. There are reports of an increasing number of cases involving serious lung problems, and even death, associated with e-cigarette use. If you can stop vaping on your own, do it before you become addicted to nicotine. If you need help quitting, ask your health care provider. There are various methods and programs that can help you stop smoking or vaping. This information is not intended to replace advice given to you by your health care provider. Make sure you discuss any questions you have with your health care provider. Document Revised: 06/30/2018 Document Reviewed: 05/15/2018 Elsevier Patient Education  2022 [employer].

## 2020-12-26 LAB — COMPREHENSIVE METABOLIC PANEL
ALT: 44 U/L (ref 0–53)
AST: 26 U/L (ref 0–37)
Albumin: 4.6 g/dL (ref 3.5–5.2)
Alkaline Phosphatase: 51 U/L (ref 39–117)
BUN: 14 mg/dL (ref 6–23)
CO2: 24 mEq/L (ref 19–32)
Calcium: 9.5 mg/dL (ref 8.4–10.5)
Chloride: 104 mEq/L (ref 96–112)
Creatinine, Ser: 1.08 mg/dL (ref 0.40–1.50)
GFR: 90.66 mL/min (ref >60.00)
Glucose, Bld: 91 mg/dL (ref 70–99)
Potassium: 4 mEq/L (ref 3.5–5.1)
Sodium: 138 mEq/L (ref 135–145)
Total Bilirubin: 0.5 mg/dL (ref 0.2–1.2)
Total Protein: 7.3 g/dL (ref 6.0–8.3)

## 2020-12-26 LAB — LIPID PANEL
Cholesterol: 217 mg/dL — ABNORMAL HIGH (ref 0–200)
HDL: 40.4 mg/dL (ref >39.00)
LDL Cholesterol: 140 mg/dL — ABNORMAL HIGH (ref 0–99)
NonHDL: 176.74
Total CHOL/HDL Ratio: 5
Triglycerides: 186 mg/dL — ABNORMAL HIGH (ref 0.0–149.0)
VLDL: 37.2 mg/dL (ref 0.0–40.0)

## 2020-12-26 LAB — CBC WITH DIFFERENTIAL/PLATELET
Basophils Absolute: 0.1 10*3/uL (ref 0.0–0.1)
Basophils Relative: 1 % (ref 0.0–3.0)
Eosinophils Absolute: 0.2 10*3/uL (ref 0.0–0.7)
Eosinophils Relative: 3 % (ref 0.0–5.0)
HCT: 43.8 % (ref 39.0–52.0)
Hemoglobin: 14.9 g/dL (ref 13.0–17.0)
Lymphocytes Relative: 32.8 % (ref 12.0–46.0)
Lymphs Abs: 2.4 10*3/uL (ref 0.7–4.0)
MCHC: 34.1 g/dL (ref 30.0–36.0)
MCV: 84.9 fl (ref 78.0–100.0)
Monocytes Absolute: 0.6 10*3/uL (ref 0.1–1.0)
Monocytes Relative: 8 % (ref 3.0–12.0)
Neutro Abs: 4.1 10*3/uL (ref 1.4–7.7)
Neutrophils Relative %: 55.2 % (ref 43.0–77.0)
Platelets: 188 10*3/uL (ref 150.0–400.0)
RBC: 5.16 Mil/uL (ref 4.22–5.81)
RDW: 13.3 % (ref 11.5–15.5)
WBC: 7.4 10*3/uL (ref 4.0–10.5)

## 2020-12-26 LAB — MICROALBUMIN / CREATININE URINE RATIO
Creatinine,U: 147.1 mg/dL
Microalb Creat Ratio: 0.5 mg/g (ref 0.0–30.0)
Microalb, Ur: <0.7 mg/dL (ref 0.0–1.9)

## 2020-12-28 ENCOUNTER — Ambulatory Visit: Payer: No Typology Code available for payment source | Admitting: Family Medicine

## 2021-01-31 IMAGING — DX DG FOOT COMPLETE 3+V*R*
3 series · 3 of 3 positions shown · non-contrast
Comparison: None.

CLINICAL DATA: Pain of MTP for 3 weeks.  Suspect gout.

EXAM:
RIGHT FOOT COMPLETE - 3+ VIEW

[foot ap]
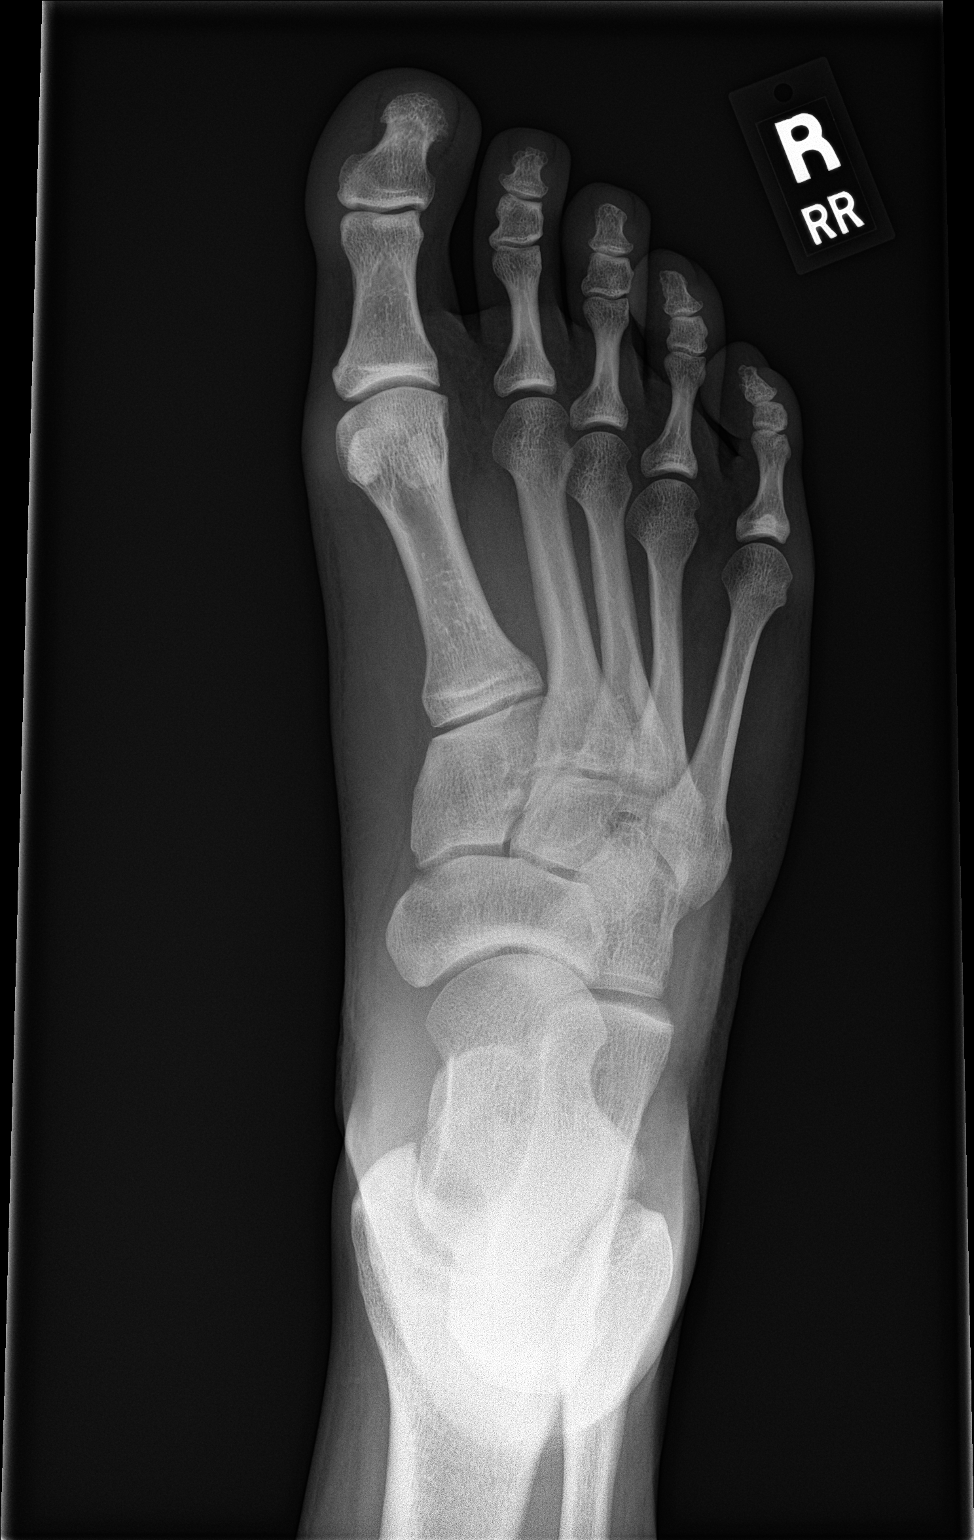

[foot obl]
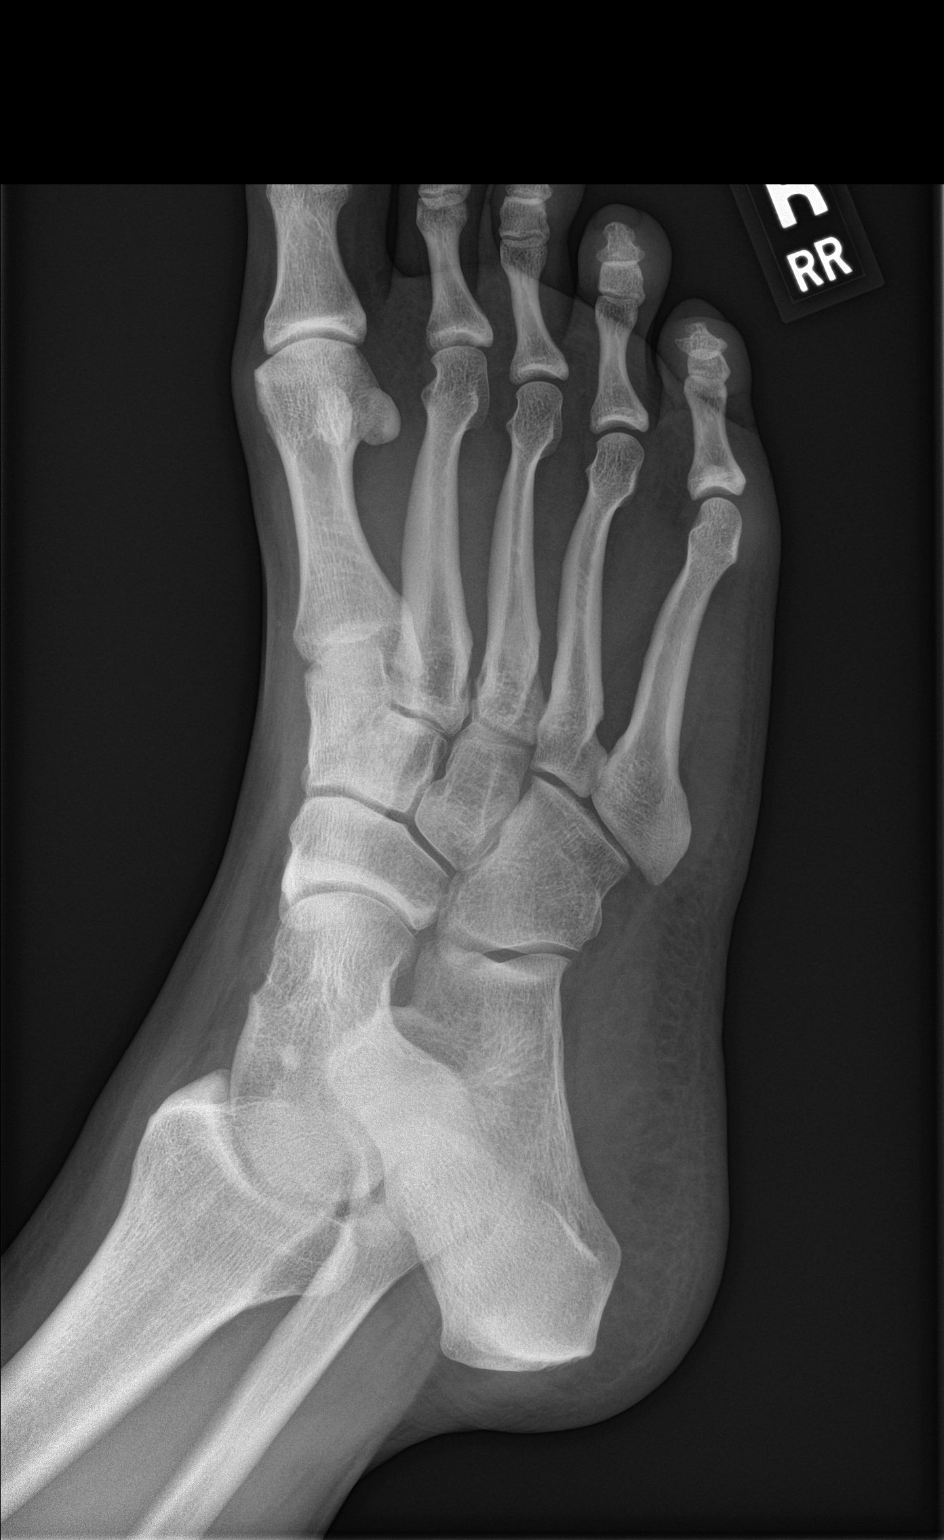

[foot lat]
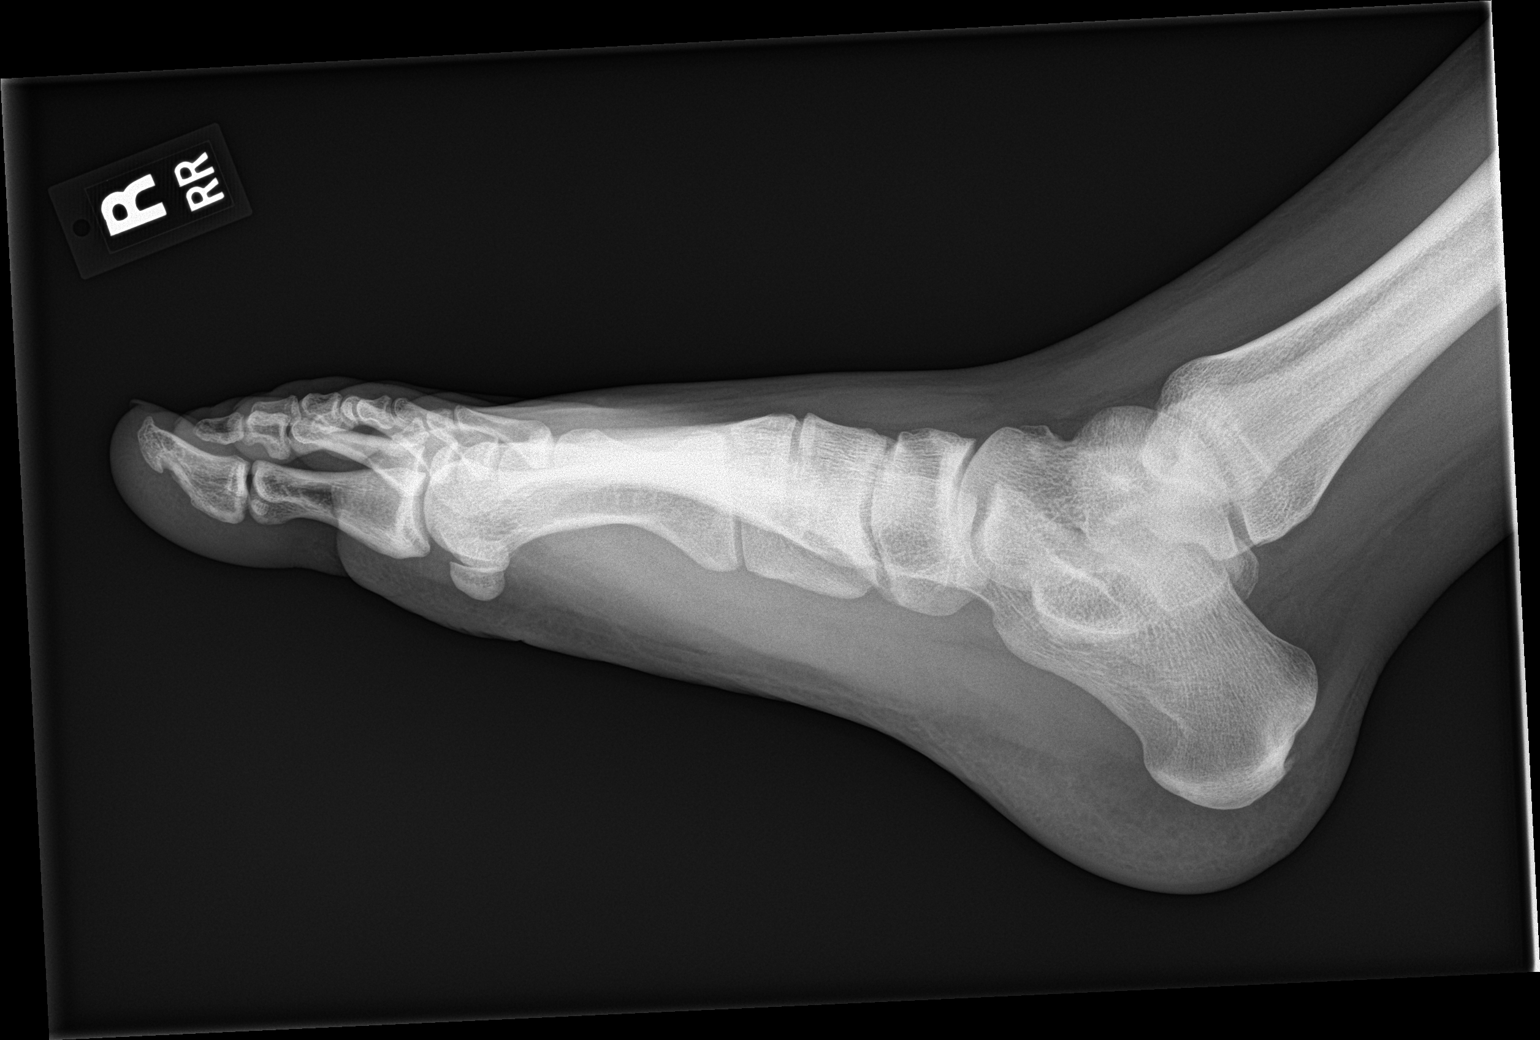

[3 of 3 positions shown; findings below may reference images not displayed]

FINDINGS: Bone mineralization is normal. Bony alignment is normal. No acute or
suspicious osseous lesion. No fracture line or displaced fracture
fragment. No erosion, periarticular demineralization or other
confirming signs of an inflammatory arthritis. Soft tissues about
the RIGHT foot are unremarkable.
IMPRESSION: Negative.

## 2021-03-29 ENCOUNTER — Ambulatory Visit: Payer: No Typology Code available for payment source | Admitting: Family Medicine

## 2021-05-10 ENCOUNTER — Ambulatory Visit: Payer: No Typology Code available for payment source | Admitting: Family Medicine

## 2021-05-25 ENCOUNTER — Other Ambulatory Visit: Payer: Self-pay | Admitting: Family Medicine

## 2021-05-25 DIAGNOSIS — J4521 Mild intermittent asthma with (acute) exacerbation: Secondary | ICD-10-CM

## 2021-08-17 ENCOUNTER — Other Ambulatory Visit: Payer: Self-pay | Admitting: Family Medicine

## 2021-08-17 DIAGNOSIS — J4521 Mild intermittent asthma with (acute) exacerbation: Secondary | ICD-10-CM

## 2021-10-20 ENCOUNTER — Other Ambulatory Visit: Payer: Self-pay | Admitting: Family Medicine

## 2021-10-20 DIAGNOSIS — F418 Other specified anxiety disorders: Secondary | ICD-10-CM

## 2021-10-20 DIAGNOSIS — J4521 Mild intermittent asthma with (acute) exacerbation: Secondary | ICD-10-CM

## 2021-10-26 ENCOUNTER — Other Ambulatory Visit: Payer: Self-pay | Admitting: Family Medicine

## 2021-10-26 DIAGNOSIS — J4521 Mild intermittent asthma with (acute) exacerbation: Secondary | ICD-10-CM

## 2021-10-26 MED ORDER — ALBUTEROL SULFATE HFA 108 (90 BASE) MCG/ACT IN AERS: 2.0000 | INHALATION_SPRAY | Freq: Four times a day (QID) | RESPIRATORY_TRACT | 0 refills | Status: DC | PRN

## 2021-11-08 ENCOUNTER — Ambulatory Visit (INDEPENDENT_AMBULATORY_CARE_PROVIDER_SITE_OTHER): Payer: No Typology Code available for payment source | Admitting: Family Medicine

## 2021-11-08 VITALS — BP 118/76 | HR 82 | Temp 98.4°F | Resp 16 | Ht 71.0 in | Wt 281.8 lb

## 2021-11-08 DIAGNOSIS — R739 Hyperglycemia, unspecified: Secondary | ICD-10-CM | POA: Diagnosis not present

## 2021-11-08 DIAGNOSIS — Z13220 Encounter for screening for lipoid disorders: Secondary | ICD-10-CM

## 2021-11-08 DIAGNOSIS — F418 Other specified anxiety disorders: Secondary | ICD-10-CM

## 2021-11-08 DIAGNOSIS — E781 Pure hyperglyceridemia: Secondary | ICD-10-CM | POA: Diagnosis not present

## 2021-11-08 DIAGNOSIS — F5104 Psychophysiologic insomnia: Secondary | ICD-10-CM

## 2021-11-08 DIAGNOSIS — E119 Type 2 diabetes mellitus without complications: Secondary | ICD-10-CM

## 2021-11-08 MED ORDER — METFORMIN HCL 500 MG PO TABS: 500.0000 mg | ORAL_TABLET | Freq: Every day | ORAL | 1 refills | Status: DC

## 2021-11-08 MED ORDER — BUPROPION HCL ER (XL) 150 MG PO TB24: 150.0000 mg | ORAL_TABLET | Freq: Every day | ORAL | 1 refills | Status: DC

## 2021-11-08 MED ORDER — HYDROXYZINE HCL 10 MG PO TABS: 10.0000 mg | ORAL_TABLET | Freq: Every evening | ORAL | 0 refills | Status: DC | PRN

## 2021-11-08 NOTE — Progress Notes (Signed)
Subjective:  Patient ID: Antonio Kelley, male    DOB: [birth year]  Age: 31 y.o. MRN: [id number]  CC:  Chief Complaint  Patient presents with   Diabetes   Depression    Pt reports has been out of Wellbutrin for some time and doesn't think he needs to restart this at this time [id number]=9   Insomnia    Pt reports trouble sleeping where he cannot turn off his brain, has tried melatonin and Z quil with little to no effect on sleep issues     HPI Antonio Kelley presents for  Follow-up.  Last visit with me in October 2022.  Diabetes: New diagnosis September 2022.  Started on metformin.  Thought to be related to weight gain over the years.  He was taking 500 mg metformin at his October 2022 visit and tolerating at that time.  He was increasing exercise at that visit. Denies barriers to care since his last visit with me in October. Has remained on metformin daily. Rare readings - unknown but normal range.  More exercise lately - disc golf, riding bike. Weight down 8 # Microalbumin: nl ratio 12/2020.  Optho, foot exam, pneumovax:   Wt Readings from Last 3 Encounters:  11/08/21 281 lb 12.8 oz (127.8 kg)  12/25/20 289 lb 12.8 oz (131.5 kg)  12/13/20 289 lb 9.6 oz (131.4 kg)     Lab Results  Component Value Date   HGBA1C 7.6 (A) 12/13/2020   HGBA1C 5.5 11/22/2016   HGBA1C 5.4 12/28/2014   Lab Results  Component Value Date   MICROALBUR <0.7 12/25/2020   LDLCALC 140 (H) 12/25/2020   CREATININE 1.08 12/25/2020   Depression with anxiety, insomnia Last discussed in October 2022.  He was intolerant to buspirone and Zoloft.  Tried Wellbutrin in September and felt okay on Wellbutrin with more energy and no new side effects. Continued on 150 mg dosing with option of 300 mg dosing.  He has been out of that medication for past month. More active past month. No depression, some flairs of anxiety off med. No worsening of anxiety on meds.  Insomnia with difficulty with getting to sleep, minimal relief  with melatonin, and ZzzQuil. Trouble turning mind off.  No relief with trazodone.    History Patient Active Problem List   Diagnosis Date Noted   Migraine without aura and without status migrainosus, not intractable 06/06/2017   Mild intermittent asthma with acute exacerbation 06/06/2017   Acute nonintractable headache 11/22/2016   Past Medical History:  Diagnosis Date   Allergy    Asthma    Hypertension    Past Surgical History:  Procedure Laterality Date   FINGER SURGERY     No Known Allergies Prior to Admission medications   Medication Sig Start Date End Date Taking? Authorizing Provider  albuterol (VENTOLIN HFA) 108 (90 Base) MCG/ACT inhaler Inhale 2 puffs into the lungs every 6 (six) hours as needed for wheezing or shortness of breath. 10/26/21  Yes Worthy Rancher B, FNP  metFORMIN (GLUCOPHAGE) 500 MG tablet Take 1 tablet (500 mg total) by mouth daily with breakfast. 12/25/20  Yes Shade Flood, MD   Social History   Socioeconomic History   Marital status: Significant Other    Spouse name: Not on file   Number of children: 1   Years of education: Not on file   Highest education level: Not on file  Occupational History   Not on file  Tobacco Use   Smoking status: Former  Types: Cigarettes   Smokeless tobacco: Former  Building services engineer Use: Every day  Substance and Sexual Activity   Alcohol use: Yes    Alcohol/week: 0.0 standard drinks of alcohol    Comment: occ 3 times a year   Drug use: No   Sexual activity: Yes  Other Topics Concern   Not on file  Social History Narrative   Lives at home with his significant other   Caffeine: 1-2 cups daily   Social Determinants of Health   Financial Resource Strain: Not on file  Food Insecurity: Not on file  Transportation Needs: Not on file  Physical Activity: Not on file  Stress: Not on file  Social Connections: Not on file  Intimate Partner Violence: Not on file    Review of Systems  Constitutional:   Negative for fatigue and unexpected weight change.  Eyes:  Negative for visual disturbance.  Respiratory:  Negative for cough, chest tightness and shortness of breath (Only with flares of anxiety/anxiety attack).   Cardiovascular:  Negative for chest pain, palpitations and leg swelling.  Gastrointestinal:  Negative for abdominal pain and blood in stool.  Neurological:  Negative for dizziness, light-headedness and headaches.     Objective:   Vitals:   11/08/21 1559  BP: 118/76  Pulse: 82  Resp: 16  Temp: 98.4 F (36.9 C)  TempSrc: Temporal  SpO2: 97%  Weight: 281 lb 12.8 oz (127.8 kg)  Height: 5\' 11"  (1.803 m)     Physical Exam Vitals reviewed.  Constitutional:      Appearance: He is well-developed.  HENT:     Head: Normocephalic and atraumatic.  Neck:     Vascular: No carotid bruit or JVD.  Cardiovascular:     Rate and Rhythm: Normal rate and regular rhythm.     Heart sounds: Normal heart sounds. No murmur heard. Pulmonary:     Effort: Pulmonary effort is normal.     Breath sounds: Normal breath sounds. No rales.  Musculoskeletal:     Right lower leg: No edema.     Left lower leg: No edema.  Skin:    General: Skin is warm and dry.  Neurological:     Mental Status: He is alert and oriented to person, place, and time.  Psychiatric:        Mood and Affect: Mood normal.        Assessment & Plan:  Antonio Kelley is a 31 y.o. male . Depression with anxiety - Plan: buPROPion (WELLBUTRIN XL) 150 MG 24 hr tablet Psychophysiological insomnia - Plan: hydrOXYzine (ATARAX) 10 MG tablet   -Some recurrence of anxiety and insomnia which is likely related as well.  Difficulty with insomnia treatment on various options as above.  We will try hydroxyzine with potential side effects discussed, dosing options.  Restart Wellbutrin initially at 150 mg daily.  Recheck 6 weeks  Type 2 diabetes mellitus without complication, without long-term current use of insulin (HCC) - Plan:  metFORMIN (GLUCOPHAGE) 500 MG tablet Hyperglycemia - Plan: Hemoglobin A1c  -Check A1c, continue metformin for now med adjustments based on lab results.  Screening for hyperlipidemia - Plan: Comprehensive metabolic panel, Lipid panel  -Check lipid panel.  Option of statin based on results, especially with diabetes. Meds ordered this encounter  Medications   buPROPion (WELLBUTRIN XL) 150 MG 24 hr tablet    Sig: Take 1 tablet (150 mg total) by mouth daily.    Dispense:  90 tablet    Refill:  1  metFORMIN (GLUCOPHAGE) 500 MG tablet    Sig: Take 1 tablet (500 mg total) by mouth daily with breakfast.    Dispense:  90 tablet    Refill:  1   hydrOXYzine (ATARAX) 10 MG tablet    Sig: Take 1-2 tablets (10-20 mg total) by mouth at bedtime as needed for anxiety (sleep).    Dispense:  30 tablet    Refill:  0   Patient Instructions  Continue metformin once per day, start back on Wellbutrin to see if that will help with the sleep as well as some of the intermittent anxiety flares.  Hydroxyzine if needed at bedtime.  See information below on sleep hygiene.  I will let you know if there are concerns on labs from today but recheck in 6 weeks.  Take care.   Insomnia Insomnia is a sleep disorder that makes it difficult to fall asleep or stay asleep. Insomnia can cause fatigue, low energy, difficulty concentrating, mood swings, and poor performance at work or school. There are three different ways to classify insomnia: Difficulty falling asleep. Difficulty staying asleep. Waking up too early in the morning. Any type of insomnia can be long-term (chronic) or short-term (acute). Both are common. Short-term insomnia usually lasts for 3 months or less. Chronic insomnia occurs at least three times a week for longer than 3 months. What are the causes? Insomnia may be caused by another condition, situation, or substance, such as: Having certain mental health conditions, such as anxiety and depression. Using  caffeine, alcohol, tobacco, or drugs. Having gastrointestinal conditions, such as gastroesophageal reflux disease (GERD). Having certain medical conditions. These include: Asthma. Alzheimer's disease. Stroke. Chronic pain. An overactive thyroid gland (hyperthyroidism). Other sleep disorders, such as restless legs syndrome and sleep apnea. Menopause. Sometimes, the cause of insomnia may not be known. What increases the risk? Risk factors for insomnia include: Gender. Females are affected more often than males. Age. Insomnia is more common as people get older. Stress and certain medical and mental health conditions. Lack of exercise. Having an irregular work schedule. This may include working night shifts and traveling between different time zones. What are the signs or symptoms? If you have insomnia, the main symptom is having trouble falling asleep or having trouble staying asleep. This may lead to other symptoms, such as: Feeling tired or having low energy. Feeling nervous about going to sleep. Not feeling rested in the morning. Having trouble concentrating. Feeling irritable, anxious, or depressed. How is this diagnosed? This condition may be diagnosed based on: Your symptoms and medical history. Your health care provider may ask about: Your sleep habits. Any medical conditions you have. Your mental health. A physical exam. How is this treated? Treatment for insomnia depends on the cause. Treatment may focus on treating an underlying condition that is causing the insomnia. Treatment may also include: Medicines to help you sleep. Counseling or therapy. Lifestyle adjustments to help you sleep better. Follow these instructions at home: Eating and drinking  Limit or avoid alcohol, caffeinated beverages, and products that contain nicotine and tobacco, especially close to bedtime. These can disrupt your sleep. Do not eat a large meal or eat spicy foods right before bedtime. This  can lead to digestive discomfort that can make it hard for you to sleep. Sleep habits  Keep a sleep diary to help you and your health care provider figure out what could be causing your insomnia. Write down: When you sleep. When you wake up during the night. How well  you sleep and how rested you feel the next day. Any side effects of medicines you are taking. What you eat and drink. Make your bedroom a dark, comfortable place where it is easy to fall asleep. Put up shades or blackout curtains to block light from outside. Use a white noise machine to block noise. Keep the temperature cool. Limit screen use before bedtime. This includes: Not watching TV. Not using your smartphone, tablet, or computer. Stick to a routine that includes going to bed and waking up at the same times every day and night. This can help you fall asleep faster. Consider making a quiet activity, such as reading, part of your nighttime routine. Try to avoid taking naps during the day so that you sleep better at night. Get out of bed if you are still awake after 15 minutes of trying to sleep. Keep the lights down, but try reading or doing a quiet activity. When you feel sleepy, go back to bed. General instructions Take over-the-counter and prescription medicines only as told by your health care provider. Exercise regularly as told by your health care provider. However, avoid exercising in the hours right before bedtime. Use relaxation techniques to manage stress. Ask your health care provider to suggest some techniques that may work well for you. These may include: Breathing exercises. Routines to release muscle tension. Visualizing peaceful scenes. Make sure that you drive carefully. Do not drive if you feel very sleepy. Keep all follow-up visits. This is important. Contact a health care provider if: You are tired throughout the day. You have trouble in your daily routine due to sleepiness. You continue to have  sleep problems, or your sleep problems get worse. Get help right away if: You have thoughts about hurting yourself or someone else. Get help right away if you feel like you may hurt yourself or others, or have thoughts about taking your own life. Go to your nearest emergency room or: Call 911. Call the National Suicide Prevention Lifeline at 306-317-2128 or 988. This is open 24 hours a day. Text the Crisis Text Line at 937-199-1147. Summary Insomnia is a sleep disorder that makes it difficult to fall asleep or stay asleep. Insomnia can be long-term (chronic) or short-term (acute). Treatment for insomnia depends on the cause. Treatment may focus on treating an underlying condition that is causing the insomnia. Keep a sleep diary to help you and your health care provider figure out what could be causing your insomnia. This information is not intended to replace advice given to you by your health care provider. Make sure you discuss any questions you have with your health care provider. Document Revised: 02/12/2021 Document Reviewed: 02/12/2021 Elsevier Patient Education  2023 [employer]     Signed,   [name], MD [facility], [facility] 11/08/21 4:33 PM

## 2021-11-08 NOTE — Patient Instructions (Addendum)
Continue metformin once per day, start back on Wellbutrin to see if that will help with the sleep as well as some of the intermittent anxiety flares.  Hydroxyzine if needed at bedtime.  See information below on sleep hygiene.  I will let you know if there are concerns on labs from today but recheck in 6 weeks.  Take care.   Insomnia Insomnia is a sleep disorder that makes it difficult to fall asleep or stay asleep. Insomnia can cause fatigue, low energy, difficulty concentrating, mood swings, and poor performance at work or school. There are three different ways to classify insomnia: Difficulty falling asleep. Difficulty staying asleep. Waking up too early in the morning. Any type of insomnia can be long-term (chronic) or short-term (acute). Both are common. Short-term insomnia usually lasts for 3 months or less. Chronic insomnia occurs at least three times a week for longer than 3 months. What are the causes? Insomnia may be caused by another condition, situation, or substance, such as: Having certain mental health conditions, such as anxiety and depression. Using caffeine, alcohol, tobacco, or drugs. Having gastrointestinal conditions, such as gastroesophageal reflux disease (GERD). Having certain medical conditions. These include: Asthma. Alzheimer's disease. Stroke. Chronic pain. An overactive thyroid gland (hyperthyroidism). Other sleep disorders, such as restless legs syndrome and sleep apnea. Menopause. Sometimes, the cause of insomnia may not be known. What increases the risk? Risk factors for insomnia include: Gender. Females are affected more often than males. Age. Insomnia is more common as people get older. Stress and certain medical and mental health conditions. Lack of exercise. Having an irregular work schedule. This may include working night shifts and traveling between different time zones. What are the signs or symptoms? If you have insomnia, the main symptom is  having trouble falling asleep or having trouble staying asleep. This may lead to other symptoms, such as: Feeling tired or having low energy. Feeling nervous about going to sleep. Not feeling rested in the morning. Having trouble concentrating. Feeling irritable, anxious, or depressed. How is this diagnosed? This condition may be diagnosed based on: Your symptoms and medical history. Your health care provider may ask about: Your sleep habits. Any medical conditions you have. Your mental health. A physical exam. How is this treated? Treatment for insomnia depends on the cause. Treatment may focus on treating an underlying condition that is causing the insomnia. Treatment may also include: Medicines to help you sleep. Counseling or therapy. Lifestyle adjustments to help you sleep better. Follow these instructions at home: Eating and drinking  Limit or avoid alcohol, caffeinated beverages, and products that contain nicotine and tobacco, especially close to bedtime. These can disrupt your sleep. Do not eat a large meal or eat spicy foods right before bedtime. This can lead to digestive discomfort that can make it hard for you to sleep. Sleep habits  Keep a sleep diary to help you and your health care provider figure out what could be causing your insomnia. Write down: When you sleep. When you wake up during the night. How well you sleep and how rested you feel the next day. Any side effects of medicines you are taking. What you eat and drink. Make your bedroom a dark, comfortable place where it is easy to fall asleep. Put up shades or blackout curtains to block light from outside. Use a white noise machine to block noise. Keep the temperature cool. Limit screen use before bedtime. This includes: Not watching TV. Not using your smartphone, tablet, or computer. Stick  to a routine that includes going to bed and waking up at the same times every day and night. This can help you fall  asleep faster. Consider making a quiet activity, such as reading, part of your nighttime routine. Try to avoid taking naps during the day so that you sleep better at night. Get out of bed if you are still awake after 15 minutes of trying to sleep. Keep the lights down, but try reading or doing a quiet activity. When you feel sleepy, go back to bed. General instructions Take over-the-counter and prescription medicines only as told by your health care provider. Exercise regularly as told by your health care provider. However, avoid exercising in the hours right before bedtime. Use relaxation techniques to manage stress. Ask your health care provider to suggest some techniques that may work well for you. These may include: Breathing exercises. Routines to release muscle tension. Visualizing peaceful scenes. Make sure that you drive carefully. Do not drive if you feel very sleepy. Keep all follow-up visits. This is important. Contact a health care provider if: You are tired throughout the day. You have trouble in your daily routine due to sleepiness. You continue to have sleep problems, or your sleep problems get worse. Get help right away if: You have thoughts about hurting yourself or someone else. Get help right away if you feel like you may hurt yourself or others, or have thoughts about taking your own life. Go to your nearest emergency room or: Call 911. Call the National Suicide Prevention Lifeline at 270-137-1551 or 988. This is open 24 hours a day. Text the Crisis Text Line at 773-568-8129. Summary Insomnia is a sleep disorder that makes it difficult to fall asleep or stay asleep. Insomnia can be long-term (chronic) or short-term (acute). Treatment for insomnia depends on the cause. Treatment may focus on treating an underlying condition that is causing the insomnia. Keep a sleep diary to help you and your health care provider figure out what could be causing your insomnia. This  information is not intended to replace advice given to you by your health care provider. Make sure you discuss any questions you have with your health care provider. Document Revised: 02/12/2021 Document Reviewed: 02/12/2021 Elsevier Patient Education  2023 [employer].

## 2021-11-09 LAB — COMPREHENSIVE METABOLIC PANEL
ALT: 28 U/L (ref 0–53)
AST: 19 U/L (ref 0–37)
Albumin: 4.7 g/dL (ref 3.5–5.2)
Alkaline Phosphatase: 49 U/L (ref 39–117)
BUN: 12 mg/dL (ref 6–23)
CO2: 27 mEq/L (ref 19–32)
Calcium: 9.9 mg/dL (ref 8.4–10.5)
Chloride: 105 mEq/L (ref 96–112)
Creatinine, Ser: 1.12 mg/dL (ref 0.40–1.50)
GFR: 86.26 mL/min (ref >60.00)
Glucose, Bld: 79 mg/dL (ref 70–99)
Potassium: 4 mEq/L (ref 3.5–5.1)
Sodium: 137 mEq/L (ref 135–145)
Total Bilirubin: 0.4 mg/dL (ref 0.2–1.2)
Total Protein: 7.7 g/dL (ref 6.0–8.3)

## 2021-11-09 LAB — LIPID PANEL
Cholesterol: 225 mg/dL — ABNORMAL HIGH (ref 0–200)
HDL: 39.4 mg/dL (ref >39.00)
NonHDL: 185.99
Total CHOL/HDL Ratio: 6
Triglycerides: 207 mg/dL — ABNORMAL HIGH (ref 0.0–149.0)
VLDL: 41.4 mg/dL — ABNORMAL HIGH (ref 0.0–40.0)

## 2021-11-09 LAB — LDL CHOLESTEROL, DIRECT: Direct LDL: 152 mg/dL

## 2021-11-09 LAB — HEMOGLOBIN A1C: Hgb A1c MFr Bld: 6.8 % — ABNORMAL HIGH (ref 4.6–6.5)

## 2021-11-13 ENCOUNTER — Encounter: Payer: Self-pay | Admitting: Family Medicine

## 2021-12-21 ENCOUNTER — Encounter: Payer: Self-pay | Admitting: Family Medicine

## 2021-12-21 ENCOUNTER — Ambulatory Visit (INDEPENDENT_AMBULATORY_CARE_PROVIDER_SITE_OTHER): Payer: No Typology Code available for payment source | Admitting: Family Medicine

## 2021-12-21 VITALS — BP 118/78 | HR 81 | Temp 98.2°F | Ht 71.0 in | Wt 277.2 lb

## 2021-12-21 DIAGNOSIS — E119 Type 2 diabetes mellitus without complications: Secondary | ICD-10-CM | POA: Diagnosis not present

## 2021-12-21 DIAGNOSIS — E785 Hyperlipidemia, unspecified: Secondary | ICD-10-CM | POA: Diagnosis not present

## 2021-12-21 DIAGNOSIS — F418 Other specified anxiety disorders: Secondary | ICD-10-CM

## 2021-12-21 DIAGNOSIS — F5104 Psychophysiologic insomnia: Secondary | ICD-10-CM

## 2021-12-21 DIAGNOSIS — R0781 Pleurodynia: Secondary | ICD-10-CM

## 2021-12-21 DIAGNOSIS — J4521 Mild intermittent asthma with (acute) exacerbation: Secondary | ICD-10-CM

## 2021-12-21 MED ORDER — ATORVASTATIN CALCIUM 10 MG PO TABS: 10.0000 mg | ORAL_TABLET | Freq: Every day | ORAL | 0 refills | Status: DC

## 2021-12-21 MED ORDER — HYDROXYZINE HCL 10 MG PO TABS: 10.0000 mg | ORAL_TABLET | Freq: Every evening | ORAL | 0 refills | Status: DC | PRN

## 2021-12-21 MED ORDER — BUPROPION HCL ER (XL) 150 MG PO TB24: 150.0000 mg | ORAL_TABLET | Freq: Two times a day (BID) | ORAL | 1 refills | Status: DC

## 2021-12-21 MED ORDER — PROAIR RESPICLICK 108 (90 BASE) MCG/ACT IN AEPB: 1.0000 | INHALATION_SPRAY | RESPIRATORY_TRACT | 1 refills | Status: DC | PRN

## 2021-12-21 MED ORDER — METFORMIN HCL 500 MG PO TABS: 500.0000 mg | ORAL_TABLET | Freq: Every day | ORAL | 1 refills | Status: DC

## 2021-12-21 NOTE — Patient Instructions (Addendum)
Increase to the wellbutrin to 2 times per day. Hydroxyzine if needed for sleep.  X-ray at the imaging facility below at your convenience. If albuterol needed more often than 2 times per week, or nighttime symptoms, let me know and we can discuss other meds.  Lipitor once per week initially then increase to daily if tolerated.  Can recheck labs after being on that medication for 6 weeks.  If any new side effects, let me know but I expect you to tolerate that well.  Follow-up in 6 weeks.  Thanks for coming in today. [facility] in 8:30-4:30 during weekdays, no appointment needed [place].  [place], [place] [address]

## 2021-12-21 NOTE — Progress Notes (Signed)
Subjective:  Patient ID: Antonio Kelley, male    DOB: 1990-10-05  Age: 31 y.o. MRN: [id number]  CC:  Chief Complaint  Patient presents with   Medication Refill   Rib Injury    Pt states he feels like one rib is sticking out more than the other on left side   Depression    PHQ9 - 6   Anxiety    GAD - 9    HPI Antonio Kelley presents for   Multiple concerns above.   Anxiety/depression Last discussed August 24.  Some recurrence of symptoms with anxiety and insomnia.  Hydroxyzine 10 mg discussed with various dosing options and restarted Wellbutrin initially 150 mg daily.  Doing ok. About the same. No side effects.  Hydroxyzine initially for sleep, has been able to sleep better.       12/21/2021   12:15 PM 11/16/2020   10:31 AM 02/03/2020    2:30 PM  GAD 7 : Generalized Anxiety Score  Nervous, Anxious, on Edge 2 3 3   Control/stop worrying 1 2 3   Worry too much - different things 1 2 1   Trouble relaxing 2 3 3   Restless 2 3 3   Easily annoyed or irritable 2 1 1   Afraid - awful might happen 1 1 3   Total GAD 7 Score 11 15 17    Rib concern Left mid rib- lower part. Sore for past year. No injury known. Seems to push out, flattens with arm elevation.   Mild intermittent asthma Less need recently of albuterol - only about once per week. No daily meds.  Would also like to change albuterol back to respiclick - son pulls out canister of current inhaler.   Hyperlipidemia: See message - he is ok with staring new med. Hx of DM.  The ASCVD Risk score (Arnett DK, et al., 2019) failed to calculate for the following reasons:   The 2019 ASCVD risk score is only valid for ages 69 to 44  Lab Results  Component Value Date   CHOL 225 (H) 11/08/2021   HDL 39.40 11/08/2021   LDLCALC 140 (H) 12/25/2020   LDLDIRECT 152.0 11/08/2021   TRIG 207.0 (H) 11/08/2021   CHOLHDL 6 11/08/2021   Lab Results  Component Value Date   ALT 28 11/08/2021   AST 19 11/08/2021   ALKPHOS 49 11/08/2021    BILITOT 0.4 11/08/2021        History Patient Active Problem List   Diagnosis Date Noted   Migraine without aura and without status migrainosus, not intractable 06/06/2017   Mild intermittent asthma with acute exacerbation 06/06/2017   Acute nonintractable headache 11/22/2016   Past Medical History:  Diagnosis Date   Allergy    Asthma    Hypertension    Past Surgical History:  Procedure Laterality Date   FINGER SURGERY     No Known Allergies Prior to Admission medications   Medication Sig Start Date End Date Taking? Authorizing Provider  albuterol (VENTOLIN HFA) 108 (90 Base) MCG/ACT inhaler Inhale 2 puffs into the lungs every 6 (six) hours as needed for wheezing or shortness of breath. 10/26/21  Yes 11/10/2021 B, FNP  buPROPion (WELLBUTRIN XL) 150 MG 24 hr tablet Take 1 tablet (150 mg total) by mouth daily. 11/08/21  Yes 11/10/2021, MD  hydrOXYzine (ATARAX) 10 MG tablet Take 1-2 tablets (10-20 mg total) by mouth at bedtime as needed for anxiety (sleep). 11/08/21  Yes 06/08/2017, MD  metFORMIN (GLUCOPHAGE) 500 MG tablet Take 1  tablet (500 mg total) by mouth daily with breakfast. 11/08/21  Yes Shade Flood, MD   Social History   Socioeconomic History   Marital status: Significant Other    Spouse name: Not on file   Number of children: 1   Years of education: Not on file   Highest education level: Not on file  Occupational History   Not on file  Tobacco Use   Smoking status: Former    Types: Cigarettes   Smokeless tobacco: Former  Building services engineer Use: Every day  Substance and Sexual Activity   Alcohol use: Yes    Alcohol/week: 0.0 standard drinks of alcohol    Comment: occ 3 times a year   Drug use: No   Sexual activity: Yes  Other Topics Concern   Not on file  Social History Narrative   Lives at home with his significant other   Caffeine: 1-2 cups daily   Social Determinants of Health   Financial Resource Strain: Not on file  Food  Insecurity: Not on file  Transportation Needs: Not on file  Physical Activity: Not on file  Stress: Not on file  Social Connections: Not on file  Intimate Partner Violence: Not on file    Review of Systems Per HPI  Objective:   Vitals:   12/21/21 1105  BP: 118/78  Pulse: 81  Temp: 98.2 F (36.8 C)  SpO2: 97%  Weight: 277 lb 3.2 oz (125.7 kg)  Height: 5\' 11"  (1.803 m)     Physical Exam Vitals reviewed.  Constitutional:      Appearance: He is well-developed.  HENT:     Head: Normocephalic and atraumatic.  Neck:     Vascular: No carotid bruit or JVD.  Cardiovascular:     Rate and Rhythm: Normal rate and regular rhythm.     Heart sounds: Normal heart sounds. No murmur heard. Pulmonary:     Effort: Pulmonary effort is normal.     Breath sounds: Normal breath sounds. No rales.     Comments: Prominent rib lower/mid anterior chest wall, midclavicular line.  No focal tenderness on exam (notes discomfort when laying on area at night only).   Lungs clear. Musculoskeletal:     Right lower leg: No edema.     Left lower leg: No edema.  Skin:    General: Skin is warm and dry.  Neurological:     Mental Status: He is alert and oriented to person, place, and time.  Psychiatric:        Mood and Affect: Mood normal.        Assessment & Plan:  Antonio Kelley is a 31 y.o. male . Depression with anxiety - Plan: buPROPion (WELLBUTRIN XL) 150 MG 24 hr tablet Psychophysiological insomnia - Plan: hydrOXYzine (ATARAX) 10 MG tablet  -Insomnia improved, anxiety symptoms stable.  Suspect some slight improvement with Wellbutrin but will try higher dose at 150 mg twice daily.  If side effects on that dosing, return to daily dosing  Type 2 diabetes mellitus without complication, without long-term current use of insulin (HCC) - Plan: metFORMIN (GLUCOPHAGE) 500 MG tablet  -Tolerating current dose metformin, continue same.  Recent labs noted.  Hyperlipidemia, unspecified hyperlipidemia type  - Plan: atorvastatin (LIPITOR) 10 MG tablet  -With diabetes and based on last LDL we will try low-dose statin, Lipitor initially once per week and increase to daily dosing as tolerated.  Repeat labs 6 weeks after consistent use.  Potential side effects discussed, RTC precautions  given if intolerant.  Mild intermittent asthma with acute exacerbation - Plan: Albuterol Sulfate (PROAIR RESPICLICK) 169 (90 Base) MCG/ACT AEPB  -Overall stable, refilled ProAir Respiclick.  RTC precautions if frequent need.  Rib pain on left side - Plan: DG Ribs Unilateral W/Chest Left  -Prominence of rib as above, no known injury.  Occasional discomfort.  Check rib series, RTC precautions if persistent or worsening discomfort.  Meds ordered this encounter  Medications   buPROPion (WELLBUTRIN XL) 150 MG 24 hr tablet    Sig: Take 1 tablet (150 mg total) by mouth in the morning and at bedtime.    Dispense:  180 tablet    Refill:  1   hydrOXYzine (ATARAX) 10 MG tablet    Sig: Take 1-2 tablets (10-20 mg total) by mouth at bedtime as needed for anxiety (sleep).    Dispense:  30 tablet    Refill:  0   metFORMIN (GLUCOPHAGE) 500 MG tablet    Sig: Take 1 tablet (500 mg total) by mouth daily with breakfast.    Dispense:  90 tablet    Refill:  1   Albuterol Sulfate (PROAIR RESPICLICK) 678 (90 Base) MCG/ACT AEPB    Sig: Inhale 1-2 puffs into the lungs every 4 (four) hours as needed.    Dispense:  1 each    Refill:  1   atorvastatin (LIPITOR) 10 MG tablet    Sig: Take 1 tablet (10 mg total) by mouth daily. Ok to start one day per week and increase as tolerated to QD dosing.    Dispense:  90 tablet    Refill:  0   Patient Instructions  Increase to the wellbutrin to 2 times per day. Hydroxyzine if needed for sleep.  X-ray at the imaging facility below at your convenience. If albuterol needed more often than 2 times per week, or nighttime symptoms, let me know and we can discuss other meds.  Lipitor once per week  initially then increase to daily if tolerated.  Can recheck labs after being on that medication for 6 weeks.  If any new side effects, let me know but I expect you to tolerate that well.  Follow-up in 6 weeks.  Thanks for coming in today. [facility] in 8:30-4:30 during weekdays, no appointment needed [place].  [place], [place] [address]     Signed,   Merri Ray, MD [place] 12/21/21 2:15 PM

## 2022-01-07 ENCOUNTER — Telehealth: Payer: Self-pay

## 2022-01-07 ENCOUNTER — Other Ambulatory Visit: Payer: Self-pay

## 2022-01-07 DIAGNOSIS — E785 Hyperlipidemia, unspecified: Secondary | ICD-10-CM

## 2022-01-07 MED ORDER — ATORVASTATIN CALCIUM 10 MG PO TABS: 10.0000 mg | ORAL_TABLET | Freq: Every day | ORAL | 0 refills | Status: DC

## 2022-01-07 NOTE — Progress Notes (Unsigned)
Call from patients wife stating he requires an urgent PA on his proair he is OUT of this at this time   Scripts paid for through [employer] [facility] mailservice we should receive a request form

## 2022-01-07 NOTE — Telephone Encounter (Signed)
Cover My meds is unable to find pt insurance .Submitted the PA thru Fax with [facility] [facility] . I have the form and pa has been faxed

## 2022-01-07 NOTE — Progress Notes (Unsigned)
Call from patients wife stating he requires an urgent PA on his proair he is OUT of this at this time   Scripts paid for through [employer] [facility] mailservice we should receive a request form  

## 2022-01-07 NOTE — Telephone Encounter (Signed)
Will submit a PA thru Cover my meds

## 2022-01-07 NOTE — Telephone Encounter (Signed)
Patients wife called and informed us the patient requires an urgent PA as he is currently out of the Albuterol ProAir inhaler  Through [facility]

## 2022-01-10 ENCOUNTER — Other Ambulatory Visit (HOSPITAL_COMMUNITY): Payer: Self-pay

## 2022-01-18 ENCOUNTER — Other Ambulatory Visit (HOSPITAL_COMMUNITY): Payer: Self-pay

## 2022-01-18 ENCOUNTER — Telehealth: Payer: Self-pay

## 2022-01-18 NOTE — Telephone Encounter (Signed)
Pharmacy Patient Advocate Encounter   Received notification from [facility] that prior authorization for ProAir RespiClick 233 is required/requested.     PA submitted on 01/18/22 to [facility] via CoverMyMeds  [facility]  PA Case ID: [id number]  Status is pending

## 2022-01-22 ENCOUNTER — Other Ambulatory Visit (HOSPITAL_COMMUNITY): Payer: Self-pay

## 2022-01-28 ENCOUNTER — Other Ambulatory Visit (HOSPITAL_COMMUNITY): Payer: Self-pay

## 2022-01-28 NOTE — Telephone Encounter (Signed)
Pharmacy Patient Advocate Encounter  Received notification from [employer] that the request for prior authorization for Proair Respiclick 108 has been denied due to .     This determination is currently being reviewed for appeal.    Specialty Pharmacy Patient Advocate Fax:  [phone]

## 2022-01-28 NOTE — Telephone Encounter (Signed)
Please review and advise.

## 2022-01-29 NOTE — Telephone Encounter (Signed)
Noted.  Please call patient, let him know that the respiclick was not on formulary and denied.  It would only be covered if there was inadequate response, failed or cannot use albuterol inhaler.  I know we had discussed the change to respiclick due to issues with the canister being pulled out of the current inhaler, but does not appear will be covered for that reason.  Let me know how he would like to proceed.

## 2022-01-29 NOTE — Telephone Encounter (Signed)
Called LM to call back and discuss denial

## 2022-01-30 MED ORDER — ALBUTEROL SULFATE HFA 108 (90 BASE) MCG/ACT IN AERS: 1.0000 | INHALATION_SPRAY | RESPIRATORY_TRACT | 1 refills | Status: DC | PRN

## 2022-01-30 NOTE — Telephone Encounter (Signed)
Spoke to patient and did explain circumstances he was understanding notes he is going to try to find someplace to keep inhaler away from son but has proven challenging. Notes he is okay with continuing formulary for coverage concerns

## 2022-01-30 NOTE — Telephone Encounter (Signed)
Noted.  Albuterol inhaler refill sent.

## 2022-02-14 ENCOUNTER — Encounter: Payer: Self-pay | Admitting: Family Medicine

## 2023-02-03 ENCOUNTER — Other Ambulatory Visit: Payer: Self-pay | Admitting: Family Medicine

## 2023-02-03 DIAGNOSIS — F418 Other specified anxiety disorders: Secondary | ICD-10-CM

## 2023-02-03 DIAGNOSIS — E119 Type 2 diabetes mellitus without complications: Secondary | ICD-10-CM

## 2023-02-05 ENCOUNTER — Telehealth: Payer: Self-pay | Admitting: Family Medicine

## 2023-02-05 DIAGNOSIS — F418 Other specified anxiety disorders: Secondary | ICD-10-CM

## 2023-02-05 NOTE — Telephone Encounter (Signed)
Caller name: [facility]   On DPR?: Yes  Call back number: [phone]  Provider they see: Shade Flood, MD  Reason for call:   Clarification on buPROPion (WELLBUTRIN XL) 150 MG 24 hr tablet. Being dosed twice daily insurance is questioning. Is Neva Seat sure of dosing?

## 2023-02-05 NOTE — Telephone Encounter (Signed)
That is correct.  He is on the XL formulation which is daily dosing,  not the sustained-release formulation that is twice per day. Since he is on the XL formulation, should take both pills in the morning.  If he has been tolerating the 150 mg pills 2/day, we can change him to the 300 mg XL 1 QAM.  I called him to discuss this information but went to a generic voicemail.  I will send a MyChart message but please call him tomorrow with this information.  Thanks.

## 2023-02-05 NOTE — Telephone Encounter (Signed)
Sent to Dr.Greene for clarification

## 2023-02-06 MED ORDER — BUPROPION HCL ER (XL) 300 MG PO TB24: 300.0000 mg | ORAL_TABLET | Freq: Every day | ORAL | 2 refills | Status: DC

## 2023-02-06 NOTE — Telephone Encounter (Signed)
Noted, 300 mg dose sent to pharmacy.  Should be taken in the morning.  Let me know if there are questions or any side effects with that dosing.

## 2023-02-06 NOTE — Telephone Encounter (Signed)
Pt notes doing okay with current dose but he is okay with going to the single pill if that's better. Patient does not have a current preference

## 2023-02-06 NOTE — Addendum Note (Signed)
Addended by: [name] R on: 02/06/2023 05:42 PM   Modules accepted: Orders

## 2023-02-07 NOTE — Telephone Encounter (Signed)
Patient has been called and informed.

## 2023-04-08 ENCOUNTER — Other Ambulatory Visit: Payer: Self-pay | Admitting: Family Medicine

## 2023-04-08 DIAGNOSIS — E119 Type 2 diabetes mellitus without complications: Secondary | ICD-10-CM

## 2023-04-16 ENCOUNTER — Other Ambulatory Visit: Payer: Self-pay | Admitting: Family Medicine

## 2023-04-16 DIAGNOSIS — E119 Type 2 diabetes mellitus without complications: Secondary | ICD-10-CM

## 2023-04-16 NOTE — Telephone Encounter (Signed)
Copied from CRM [id number]. Topic: Clinical - Medication Refill >> Apr 16, 2023  5:30 PM Antonio Kelley B wrote: Most Recent Primary Care Visit:  Provider: [name] R  Department: [facility]  Visit Type: OFFICE VISIT  Date: 12/21/2021  Medication: metFORMIN (GLUCOPHAGE) 500 MG tablet   Has the patient contacted their pharmacy? Yes (Agent: If no, request that the patient contact the pharmacy for the refill. If patient does not wish to contact the pharmacy document the reason why and proceed with request.) (Agent: If yes, when and what did the pharmacy advise?)  Is this the correct pharmacy for this prescription? Yes If no, delete pharmacy and type the correct one.  This is the patient's preferred pharmacy:   [facility] [address] [place], [place] - [address] [place] [address] Phone: [phone] Fax: [phone]   Has the prescription been filled recently? Yes  Is the patient out of the medication? Yes  Has the patient been seen for an appointment in the last year OR does the patient have an upcoming appointment? Yes  Can we respond through MyChart? Yes  Agent: Please be advised that Rx refills may take up to 3 business days. We ask that you follow-up with your pharmacy.

## 2023-04-21 ENCOUNTER — Other Ambulatory Visit: Payer: Self-pay | Admitting: Family Medicine

## 2023-04-21 DIAGNOSIS — E119 Type 2 diabetes mellitus without complications: Secondary | ICD-10-CM

## 2023-04-21 NOTE — Telephone Encounter (Signed)
Copied from CRM [id number]. Topic: Clinical - Medication Refill >> Apr 21, 2023  2:19 PM Sonny Dandy B wrote: Most Recent Primary Care Visit:  Provider: [name] R  Department: [facility]  Visit Type: OFFICE VISIT  Date: 12/21/2021  Medication: metFORMIN (GLUCOPHAGE) 500 MG tablet  Has the patient contacted their pharmacy? Yes (Agent: If no, request that the patient contact the pharmacy for the refill. If patient does not wish to contact the pharmacy document the reason why and proceed with request.) (Agent: If yes, when and what did the pharmacy advise?)  Is this the correct pharmacy for this prescription? Yes If no, delete pharmacy and type the correct one.  This is the patient's preferred pharmacy:    [facility] [address] [place], [place] - [address] [place] [address] Phone: [phone] Fax: [phone]   Has the prescription been filled recently? Yes  Is the patient out of the medication? Yes  Has the patient been seen for an appointment in the last year OR does the patient have an upcoming appointment? Yes  Can we respond through MyChart? Yes  Agent: Please be advised that Rx refills may take up to 3 business days. We ask that you follow-up with your pharmacy.

## 2023-04-23 ENCOUNTER — Encounter: Payer: Self-pay | Admitting: Family Medicine

## 2023-04-23 DIAGNOSIS — E119 Type 2 diabetes mellitus without complications: Secondary | ICD-10-CM

## 2023-04-23 MED ORDER — METFORMIN HCL 500 MG PO TABS: 500.0000 mg | ORAL_TABLET | Freq: Every day | ORAL | 0 refills | Status: DC

## 2023-05-05 ENCOUNTER — Encounter: Payer: Self-pay | Admitting: Family Medicine

## 2023-05-05 ENCOUNTER — Ambulatory Visit (INDEPENDENT_AMBULATORY_CARE_PROVIDER_SITE_OTHER): Payer: No Typology Code available for payment source | Admitting: Family Medicine

## 2023-05-05 VITALS — BP 126/70 | HR 78 | Temp 98.5°F | Ht 70.75 in | Wt 271.8 lb

## 2023-05-05 DIAGNOSIS — E1169 Type 2 diabetes mellitus with other specified complication: Secondary | ICD-10-CM

## 2023-05-05 DIAGNOSIS — J4520 Mild intermittent asthma, uncomplicated: Secondary | ICD-10-CM

## 2023-05-05 DIAGNOSIS — Z7984 Long term (current) use of oral hypoglycemic drugs: Secondary | ICD-10-CM

## 2023-05-05 DIAGNOSIS — E785 Hyperlipidemia, unspecified: Secondary | ICD-10-CM

## 2023-05-05 DIAGNOSIS — Z0000 Encounter for general adult medical examination without abnormal findings: Secondary | ICD-10-CM

## 2023-05-05 DIAGNOSIS — Z130 Encounter for screening for diseases of the blood and blood-forming organs and certain disorders involving the immune mechanism: Secondary | ICD-10-CM | POA: Diagnosis not present

## 2023-05-05 DIAGNOSIS — F418 Other specified anxiety disorders: Secondary | ICD-10-CM

## 2023-05-05 MED ORDER — ALBUTEROL SULFATE HFA 108 (90 BASE) MCG/ACT IN AERS: 1.0000 | INHALATION_SPRAY | RESPIRATORY_TRACT | 1 refills | Status: AC | PRN

## 2023-05-05 MED ORDER — METFORMIN HCL 500 MG PO TABS: 500.0000 mg | ORAL_TABLET | Freq: Every day | ORAL | 2 refills | Status: DC

## 2023-05-05 MED ORDER — BUPROPION HCL ER (XL) 300 MG PO TB24: 300.0000 mg | ORAL_TABLET | Freq: Every day | ORAL | 2 refills | Status: DC

## 2023-05-05 NOTE — Progress Notes (Unsigned)
 Subjective:  Patient ID: Antonio Kelley, male    DOB: [birth year]  Age: 33 y.o. MRN: [id number]  CC:  Chief Complaint  Patient presents with   Annual Exam    Pt is doing well no concerns, pt is fasting     HPI Antonio Kelley presents for Annual Exam And follow-up of chronic medical conditions.  Last visit with me in October 2023.  6-week follow-up recommended at that time.  Son is 6 yo, dtr is 2yo. Family is doing ok.    Mild intermittent asthma Treated with albuterol as needed. Has a nebulizer at home (for kids). Has used albuterol neb a few times when illness at home. Once per week past few months. Out of albuterol inhaler for awhile without recent follow up.   Diabetes: With hyperlipidemia.  Improved control in August 2023, no recent testing.  At that time he was taking metformin 500 mg daily.  Weight has improved by 6 pounds since that time.no change in diet/exercise. Less exercise - less disc golf.  Missed meds for a month, back on metformin past week.  No increased thirst, blurry vision, or urinary frequency.  Home readings - none recent. Microalbumin: Due Optho, foot exam, pneumovax:  Pneumovax: declined.   Wt Readings from Last 3 Encounters:  05/05/23 271 lb 12.8 oz (123.3 kg)  12/21/21 277 lb 3.2 oz (125.7 kg)  11/08/21 281 lb 12.8 oz (127.8 kg)    Lab Results  Component Value Date   HGBA1C 6.8 (H) 11/08/2021   HGBA1C 7.6 (A) 12/13/2020   HGBA1C 5.5 11/22/2016   Lab Results  Component Value Date   MICROALBUR <0.7 12/25/2020   LDLCALC 140 (H) 12/25/2020   CREATININE 1.12 11/08/2021    Hyperlipidemia: Uncontrolled and with diabetes started on statin in October 2023 with planned follow-up in 6 weeks.  As above has not been back since that time nor recent testing. Initially took once per week - leg pains on night taken. Better off meds.   Lab Results  Component Value Date   CHOL 225 (H) 11/08/2021   HDL 39.40 11/08/2021   LDLCALC 140 (H) 12/25/2020    LDLDIRECT 152.0 11/08/2021   TRIG 207.0 (H) 11/08/2021   CHOLHDL 6 11/08/2021   Lab Results  Component Value Date   ALT 28 11/08/2021   AST 19 11/08/2021   ALKPHOS 49 11/08/2021   BILITOT 0.4 11/08/2021      Depression with anxiety Insomnia previously treated with hydroxyzine -no relief.  Wellbutrin increased to higher dose of 150 mg twice per day in October 2023.  Extended release formulation, changed to 300 mg daily dosing last year, see phone call. Now on 300mg  every day. Stable control, no new side effects.      05/05/2023    3:10 PM 12/21/2021   11:03 AM 11/08/2021    4:02 PM 12/25/2020    2:21 PM 12/13/2020   11:31 AM  Depression screen PHQ 2/9  Decreased Interest 0 1 1 1 1   Down, Depressed, Hopeless 0 1 1 1 1   PHQ - 2 Score 0 2 2 2 2   Altered sleeping 2 1 3 2 2   Tired, decreased energy 1 1 1 1 2   Change in appetite 0 0 1 1 0  Feeling bad or failure about yourself  0 1 1 1 1   Trouble concentrating 0 1 1 2  0  Moving slowly or fidgety/restless 0 0 0 1 0  Suicidal thoughts 0 0 0 0 0  PHQ-9 Score  3 6 9 10 7       05/05/2023    3:11 PM 12/21/2021   12:15 PM 11/16/2020   10:31 AM 02/03/2020    2:30 PM  GAD 7 : Generalized Anxiety Score  Nervous, Anxious, on Edge 0 2 3 3   Control/stop worrying 0 1 2 3   Worry too much - different things 0 1 2 1   Trouble relaxing 0 2 3 3   Restless 0 2 3 3   Easily annoyed or irritable 1 2 1 1   Afraid - awful might happen 0 1 1 3   Total GAD 7 Score 1 11 15 17       Health Maintenance  Topic Date Due   Pneumococcal Vaccine 78-97 Years old (1 of 2 - PCV) Never done   HIV Screening  Never done   Hepatitis C Screening  Never done   Diabetic kidney evaluation - Urine ACR  12/25/2021   Diabetic kidney evaluation - eGFR measurement  11/09/2022   COVID-19 Vaccine (1 - 2024-25 season) Never done   INFLUENZA VACCINE  06/16/2023 (Originally 10/17/2022)   DTaP/Tdap/Td (2 - Td or Tdap) 11/23/2026   HPV VACCINES  Aged Out  No FH of CA other than  breast CA - mother, maternal aunt and maternal GM, father with stage 4 prostate CA - in late 63's.  Prostate: does have family history of prostate cancer - father as above No results found for: "PSA1", "PSA" HIV screening: declines  Hep C screening: declines.  Declines other STI screening.   Immunization History  Administered Date(s) Administered   Tdap 11/22/2016  Flu vaccine - declines. Covid vaccine - declines. Covid infection in November 2023 - mild sx's.   No results found. Optho - few months ago. Diabetic retinopathy screening ok.   Dental: no recent dental visit. Bad experience in the past. Prior gum surgery. Considering going.   Alcohol: Rare. Few per year.   Tobacco: vape - daily. Considering cutting back. Nicotine user since middle school.   Exercise: minimal, FITT principle discussed, start low go slow.  Knee pains at times, not limiting activity.  History Patient Active Problem List   Diagnosis Date Noted   Migraine without aura and without status migrainosus, not intractable 06/06/2017   Mild intermittent asthma with acute exacerbation 06/06/2017   Acute nonintractable headache 11/22/2016   Past Medical History:  Diagnosis Date   Allergy    Asthma    Hypertension    Past Surgical History:  Procedure Laterality Date   FINGER SURGERY     No Known Allergies Prior to Admission medications   Medication Sig Start Date End Date Taking? Authorizing Provider  albuterol (VENTOLIN HFA) 108 (90 Base) MCG/ACT inhaler Inhale 1-2 puffs into the lungs every 4 (four) hours as needed for wheezing or shortness of breath. 01/30/22  Yes Shade Flood, MD  atorvastatin (LIPITOR) 10 MG tablet Take 1 tablet (10 mg total) by mouth daily. Ok to start one day per week and increase as tolerated to QD dosing. 01/07/22  Yes Shade Flood, MD  buPROPion (WELLBUTRIN XL) 300 MG 24 hr tablet Take 1 tablet (300 mg total) by mouth daily. 02/06/23  Yes Shade Flood, MD   hydrOXYzine (ATARAX) 10 MG tablet Take 1-2 tablets (10-20 mg total) by mouth at bedtime as needed for anxiety (sleep). 12/21/21  Yes Shade Flood, MD  metFORMIN (GLUCOPHAGE) 500 MG tablet Take 1 tablet (500 mg total) by mouth daily with breakfast. 04/23/23  Yes Shade Flood,  MD   Social History   Socioeconomic History   Marital status: Significant Other    Spouse name: Not on file   Number of children: 1   Years of education: Not on file   Highest education level: Not on file  Occupational History   Not on file  Tobacco Use   Smoking status: Former    Types: Cigarettes   Smokeless tobacco: Former  Advertising account planner   Vaping status: Every Day  Substance and Sexual Activity   Alcohol use: Yes    Alcohol/week: 0.0 standard drinks of alcohol    Comment: occ 3 times a year   Drug use: No   Sexual activity: Yes  Other Topics Concern   Not on file  Social History Narrative   Lives at home with his significant other   Caffeine: 1-2 cups daily   Social Drivers of Corporate investment banker Strain: Not on file  Food Insecurity: Not on file  Transportation Needs: Not on file  Physical Activity: Not on file  Stress: Not on file  Social Connections: Not on file  Intimate Partner Violence: Not on file    Review of Systems 13 point review of systems per patient health survey noted.  Negative other than as indicated above or in HPI.    Objective:   Vitals:   05/05/23 1500  BP: 126/70  Pulse: 78  Temp: 98.5 F (36.9 C)  TempSrc: Temporal  SpO2: 98%  Weight: 271 lb 12.8 oz (123.3 kg)  Height: 5' 10.75" (1.797 m)   {Vitals History (Optional):23777}  Physical Exam Vitals reviewed.  Constitutional:      Appearance: He is well-developed.  HENT:     Head: Normocephalic and atraumatic.     Right Ear: External ear normal.     Left Ear: External ear normal.  Eyes:     Conjunctiva/sclera: Conjunctivae normal.     Pupils: Pupils are equal, round, and reactive to light.   Neck:     Thyroid: No thyromegaly.  Cardiovascular:     Rate and Rhythm: Normal rate and regular rhythm.     Heart sounds: Normal heart sounds.  Pulmonary:     Effort: Pulmonary effort is normal. No respiratory distress.     Breath sounds: Normal breath sounds. No wheezing.  Abdominal:     General: There is no distension.     Palpations: Abdomen is soft.     Tenderness: There is no abdominal tenderness.  Musculoskeletal:        General: No tenderness. Normal range of motion.     Cervical back: Normal range of motion and neck supple.  Lymphadenopathy:     Cervical: No cervical adenopathy.  Skin:    General: Skin is warm and dry.  Neurological:     Mental Status: He is alert and oriented to person, place, and time.     Deep Tendon Reflexes: Reflexes are normal and symmetric.  Psychiatric:        Behavior: Behavior normal.        Assessment & Plan:  Antonio Kelley is a 33 y.o. male . Annual physical exam  Type 2 diabetes mellitus with hyperlipidemia (HCC) - Plan: Comp Met (CMET), HgB A1c, metFORMIN (GLUCOPHAGE) 500 MG tablet, Microalbumin / creatinine urine ratio  Hyperlipidemia, unspecified hyperlipidemia type - Plan: Comp Met (CMET), Lipid panel  Screening for deficiency anemia - Plan: CBC w/Diff  Depression with anxiety - Plan: buPROPion (WELLBUTRIN XL) 300 MG 24 hr tablet  Mild intermittent  asthma without complication - Plan: albuterol (VENTOLIN HFA) 108 (90 Base) MCG/ACT inhaler   Meds ordered this encounter  Medications   albuterol (VENTOLIN HFA) 108 (90 Base) MCG/ACT inhaler    Sig: Inhale 1-2 puffs into the lungs every 4 (four) hours as needed for wheezing or shortness of breath.    Dispense:  8 g    Refill:  1   buPROPion (WELLBUTRIN XL) 300 MG 24 hr tablet    Sig: Take 1 tablet (300 mg total) by mouth daily.    Dispense:  90 tablet    Refill:  2    New dose, prior twice daily dosing of 150 mg XL formulation.   metFORMIN (GLUCOPHAGE) 500 MG tablet     Sig: Take 1 tablet (500 mg total) by mouth daily with breakfast.    Dispense:  90 tablet    Refill:  2   Patient Instructions  For asthma, lets try to restart the albuterol inhaler if needed for breakthrough asthma symptoms.  If you require the albuterol inhaler more than 2 puffs/week may need to be on a maintenance medication daily to minimize asthma flares.  Let me know if that occurs.  Depending on labs we may try a different cholesterol med or with coQ10 to see if less muscle aches.   I recommend meeting with a dentist. Here is a recommendation of someone I trust:  [facility]  - Dr. Cyndee Brightly.  Address: [address] [place], [place] [address]  [email or web address]  Phone: [phone]   Preventive Care 80-68 Years Old, Male Preventive care refers to lifestyle choices and visits with your health care provider that can promote health and wellness. Preventive care visits are also called wellness exams. What can I expect for my preventive care visit? Counseling During your preventive care visit, your health care provider may ask about your: Medical history, including: Past medical problems. Family medical history. Current health, including: Emotional well-being. Home life and relationship well-being. Sexual activity. Lifestyle, including: Alcohol, nicotine or tobacco, and drug use. Access to firearms. Diet, exercise, and sleep habits. Safety issues such as seatbelt and bike helmet use. Sunscreen use. Work and work Astronomer. Physical exam Your health care provider may check your: Height and weight. These may be used to calculate your BMI (body mass index). BMI is a measurement that tells if you are at a healthy weight. Waist circumference. This measures the distance around your waistline. This measurement also tells if you are at a healthy weight and may help predict your risk of certain diseases, such as type 2 diabetes and high blood pressure. Heart rate and blood  pressure. Body temperature. Skin for abnormal spots. What immunizations do I need?  Vaccines are usually given at various ages, according to a schedule. Your health care provider will recommend vaccines for you based on your age, medical history, and lifestyle or other factors, such as travel or where you work. What tests do I need? Screening Your health care provider may recommend screening tests for certain conditions. This may include: Lipid and cholesterol levels. Diabetes screening. This is done by checking your blood sugar (glucose) after you have not eaten for a while (fasting). Hepatitis B test. Hepatitis C test. HIV (human immunodeficiency virus) test. STI (sexually transmitted infection) testing, if you are at risk. Talk with your health care provider about your test results, treatment options, and if necessary, the need for more tests. Follow these instructions at home: Eating and drinking  Eat a healthy diet  that includes fresh fruits and vegetables, whole grains, lean protein, and low-fat dairy products. Drink enough fluid to keep your urine pale yellow. Take vitamin and mineral supplements as recommended by your health care provider. Do not drink alcohol if your health care provider tells you not to drink. If you drink alcohol: Limit how much you have to 0-2 drinks a day. Know how much alcohol is in your drink. In the [place] one drink equals one 12 oz bottle of beer (355 mL), one 5 oz glass of wine (148 mL), or one 1 oz glass of hard liquor (44 mL). Lifestyle Brush your teeth every morning and night with fluoride toothpaste. Floss one time each day. Exercise for at least 30 minutes 5 or more days each week. Do not use any products that contain nicotine or tobacco. These products include cigarettes, chewing tobacco, and vaping devices, such as e-cigarettes. If you need help quitting, ask your health care provider. Do not use drugs. If you are sexually active, practice safe  sex. Use a condom or other form of protection to prevent STIs. Find healthy ways to manage stress, such as: Meditation, yoga, or listening to music. Journaling. Talking to a trusted person. Spending time with friends and family. Minimize exposure to UV radiation to reduce your risk of skin cancer. Safety Always wear your seat belt while driving or riding in a vehicle. Do not drive: If you have been drinking alcohol. Do not ride with someone who has been drinking. If you have been using any mind-altering substances or drugs. While texting. When you are tired or distracted. Wear a helmet and other protective equipment during sports activities. If you have firearms in your house, make sure you follow all gun safety procedures. Seek help if you have been physically or sexually abused. What's next? Go to your health care provider once a year for an annual wellness visit. Ask your health care provider how often you should have your eyes and teeth checked. Stay up to date on all vaccines. This information is not intended to replace advice given to you by your health care provider. Make sure you discuss any questions you have with your health care provider. Document Revised: 08/30/2020 Document Reviewed: 08/30/2020 Elsevier Patient Education  2024 Elsevier Inc.  What actions can I take to stop vaping? If you can, stop vaping on your own before you become addicted to nicotine. If you need help quitting, ask your health care provider. There are three effective ways to fight nicotine addiction: Nicotine replacement therapy. Using nicotine gum or a nicotine patch blocks your craving for nicotine. Over time, you can reduce the amount of nicotine you use until you can stop using nicotine completely without having cravings. Prescription medicines approved to fight nicotine addiction. These stop nicotine cravings or block the effects of nicotine. Behavioral therapy. This may include: A self-help  smoking cessation program. Individual or group therapy. A smoking cessation support group. There are several national programs to help you quit smoking or vaping. These include: Text message programs, such as SmokefreeTXT. Apps for mobile phones, including the free quitSTART app. Hotlines, such as 1-800-QUIT-NOW 907 602 6152). Where to find support You can get support at these sites: U.S. Department of Health and Human Services: smokefree.gov American Lung Association: www.lung.org Where to find more information Learn more about e-cigarettes from: [employer] on Drug Abuse: http://www.price-smith.com/ Centers for Disease Control and Prevention: FootballExhibition.com.br Summary E-cigarettes can cause nicotine addiction. E-cigarettes are not approved as a way to stop smoking.  They are not a risk-free alternative to smoking tobacco. There are reports of an increasing number of cases involving serious lung problems, and even death, associated with e-cigarette use. If you can stop vaping on your own, do it before you become addicted to nicotine. If you need help quitting, ask your health care provider. There are various methods and programs that can help you stop smoking or vaping. This information is not intended to replace advice given to you by your health care provider. Make sure you discuss any questions you have with your health care provider. Document Revised: 11/29/2020 Document Reviewed: 11/29/2020 Elsevier Patient Education  2024 Elsevier Inc.     Signed,   [name], MD [facility], [facility] 05/05/23 4:03 PM

## 2023-05-05 NOTE — Patient Instructions (Addendum)
 For asthma, lets try to restart the albuterol inhaler if needed for breakthrough asthma symptoms.  If you require the albuterol inhaler more than 2 puffs/week may need to be on a maintenance medication daily to minimize asthma flares.  Let me know if that occurs.  Depending on labs we may try a different cholesterol med or with coQ10 to see if less muscle aches.   I recommend meeting with a dentist. Here is a recommendation of someone I trust:  [facility]  - Dr. Cyndee Brightly.  Address: [address] [place], [place] [address]  [email or web address]  Phone: [phone]   Preventive Care 35-83 Years Old, Male Preventive care refers to lifestyle choices and visits with your health care provider that can promote health and wellness. Preventive care visits are also called wellness exams. What can I expect for my preventive care visit? Counseling During your preventive care visit, your health care provider may ask about your: Medical history, including: Past medical problems. Family medical history. Current health, including: Emotional well-being. Home life and relationship well-being. Sexual activity. Lifestyle, including: Alcohol, nicotine or tobacco, and drug use. Access to firearms. Diet, exercise, and sleep habits. Safety issues such as seatbelt and bike helmet use. Sunscreen use. Work and work Astronomer. Physical exam Your health care provider may check your: Height and weight. These may be used to calculate your BMI (body mass index). BMI is a measurement that tells if you are at a healthy weight. Waist circumference. This measures the distance around your waistline. This measurement also tells if you are at a healthy weight and may help predict your risk of certain diseases, such as type 2 diabetes and high blood pressure. Heart rate and blood pressure. Body temperature. Skin for abnormal spots. What immunizations do I need?  Vaccines are usually given at various ages,  according to a schedule. Your health care provider will recommend vaccines for you based on your age, medical history, and lifestyle or other factors, such as travel or where you work. What tests do I need? Screening Your health care provider may recommend screening tests for certain conditions. This may include: Lipid and cholesterol levels. Diabetes screening. This is done by checking your blood sugar (glucose) after you have not eaten for a while (fasting). Hepatitis B test. Hepatitis C test. HIV (human immunodeficiency virus) test. STI (sexually transmitted infection) testing, if you are at risk. Talk with your health care provider about your test results, treatment options, and if necessary, the need for more tests. Follow these instructions at home: Eating and drinking  Eat a healthy diet that includes fresh fruits and vegetables, whole grains, lean protein, and low-fat dairy products. Drink enough fluid to keep your urine pale yellow. Take vitamin and mineral supplements as recommended by your health care provider. Do not drink alcohol if your health care provider tells you not to drink. If you drink alcohol: Limit how much you have to 0-2 drinks a day. Know how much alcohol is in your drink. In the [place] one drink equals one 12 oz bottle of beer (355 mL), one 5 oz glass of wine (148 mL), or one 1 oz glass of hard liquor (44 mL). Lifestyle Brush your teeth every morning and night with fluoride toothpaste. Floss one time each day. Exercise for at least 30 minutes 5 or more days each week. Do not use any products that contain nicotine or tobacco. These products include cigarettes, chewing tobacco, and vaping devices, such as e-cigarettes. If you need help  quitting, ask your health care provider. Do not use drugs. If you are sexually active, practice safe sex. Use a condom or other form of protection to prevent STIs. Find healthy ways to manage stress, such as: Meditation, yoga, or  listening to music. Journaling. Talking to a trusted person. Spending time with friends and family. Minimize exposure to UV radiation to reduce your risk of skin cancer. Safety Always wear your seat belt while driving or riding in a vehicle. Do not drive: If you have been drinking alcohol. Do not ride with someone who has been drinking. If you have been using any mind-altering substances or drugs. While texting. When you are tired or distracted. Wear a helmet and other protective equipment during sports activities. If you have firearms in your house, make sure you follow all gun safety procedures. Seek help if you have been physically or sexually abused. What's next? Go to your health care provider once a year for an annual wellness visit. Ask your health care provider how often you should have your eyes and teeth checked. Stay up to date on all vaccines. This information is not intended to replace advice given to you by your health care provider. Make sure you discuss any questions you have with your health care provider. Document Revised: 08/30/2020 Document Reviewed: 08/30/2020 Elsevier Patient Education  2024 Elsevier Inc.  What actions can I take to stop vaping? If you can, stop vaping on your own before you become addicted to nicotine. If you need help quitting, ask your health care provider. There are three effective ways to fight nicotine addiction: Nicotine replacement therapy. Using nicotine gum or a nicotine patch blocks your craving for nicotine. Over time, you can reduce the amount of nicotine you use until you can stop using nicotine completely without having cravings. Prescription medicines approved to fight nicotine addiction. These stop nicotine cravings or block the effects of nicotine. Behavioral therapy. This may include: A self-help smoking cessation program. Individual or group therapy. A smoking cessation support group. There are several national programs to  help you quit smoking or vaping. These include: Text message programs, such as SmokefreeTXT. Apps for mobile phones, including the free quitSTART app. Hotlines, such as 1-800-QUIT-NOW 8077170666). Where to find support You can get support at these sites: U.S. Department of Health and Human Services: smokefree.gov American Lung Association: www.lung.org Where to find more information Learn more about e-cigarettes from: [employer] on Drug Abuse: http://www.price-smith.com/ Centers for Disease Control and Prevention: FootballExhibition.com.br Summary E-cigarettes can cause nicotine addiction. E-cigarettes are not approved as a way to stop smoking. They are not a risk-free alternative to smoking tobacco. There are reports of an increasing number of cases involving serious lung problems, and even death, associated with e-cigarette use. If you can stop vaping on your own, do it before you become addicted to nicotine. If you need help quitting, ask your health care provider. There are various methods and programs that can help you stop smoking or vaping. This information is not intended to replace advice given to you by your health care provider. Make sure you discuss any questions you have with your health care provider. Document Revised: 11/29/2020 Document Reviewed: 11/29/2020 Elsevier Patient Education  2024 [employer].

## 2023-05-06 ENCOUNTER — Encounter: Payer: Self-pay | Admitting: Family Medicine

## 2023-05-06 LAB — CBC WITH DIFFERENTIAL/PLATELET
Basophils Absolute: 0.1 10*3/uL (ref 0.0–0.1)
Basophils Relative: 1.2 % (ref 0.0–3.0)
Eosinophils Absolute: 0.2 10*3/uL (ref 0.0–0.7)
Eosinophils Relative: 3.1 % (ref 0.0–5.0)
HCT: 47.9 % (ref 39.0–52.0)
Hemoglobin: 16.2 g/dL (ref 13.0–17.0)
Lymphocytes Relative: 31.4 % (ref 12.0–46.0)
Lymphs Abs: 2.4 10*3/uL (ref 0.7–4.0)
MCHC: 33.8 g/dL (ref 30.0–36.0)
MCV: 86 fL (ref 78.0–100.0)
Monocytes Absolute: 0.8 10*3/uL (ref 0.1–1.0)
Monocytes Relative: 10.2 % (ref 3.0–12.0)
Neutro Abs: 4.2 10*3/uL (ref 1.4–7.7)
Neutrophils Relative %: 54.1 % (ref 43.0–77.0)
Platelets: 231 10*3/uL (ref 150.0–400.0)
RBC: 5.57 Mil/uL (ref 4.22–5.81)
RDW: 13.7 % (ref 11.5–15.5)
WBC: 7.8 10*3/uL (ref 4.0–10.5)

## 2023-05-06 LAB — COMPREHENSIVE METABOLIC PANEL
ALT: 26 U/L (ref 0–53)
AST: 20 U/L (ref 0–37)
Albumin: 4.9 g/dL (ref 3.5–5.2)
Alkaline Phosphatase: 59 U/L (ref 39–117)
BUN: 12 mg/dL (ref 6–23)
CO2: 28 meq/L (ref 19–32)
Calcium: 9.7 mg/dL (ref 8.4–10.5)
Chloride: 101 meq/L (ref 96–112)
Creatinine, Ser: 1.06 mg/dL (ref 0.40–1.50)
GFR: 91.19 mL/min (ref >60.00)
Glucose, Bld: 102 mg/dL — ABNORMAL HIGH (ref 70–99)
Potassium: 4 meq/L (ref 3.5–5.1)
Sodium: 137 meq/L (ref 135–145)
Total Bilirubin: 0.6 mg/dL (ref 0.2–1.2)
Total Protein: 8.2 g/dL (ref 6.0–8.3)

## 2023-05-06 LAB — HEMOGLOBIN A1C: Hgb A1c MFr Bld: 7.5 % — ABNORMAL HIGH (ref 4.6–6.5)

## 2023-05-06 LAB — LIPID PANEL
Cholesterol: 237 mg/dL — ABNORMAL HIGH (ref 0–200)
HDL: 42.8 mg/dL (ref >39.00)
LDL Cholesterol: 149 mg/dL — ABNORMAL HIGH (ref 0–99)
NonHDL: 193.77
Total CHOL/HDL Ratio: 6
Triglycerides: 222 mg/dL — ABNORMAL HIGH (ref 0.0–149.0)
VLDL: 44.4 mg/dL — ABNORMAL HIGH (ref 0.0–40.0)

## 2023-05-06 LAB — MICROALBUMIN / CREATININE URINE RATIO
Creatinine,U: 135.6 mg/dL
Microalb Creat Ratio: 6.3 mg/g (ref 0.0–30.0)
Microalb, Ur: 0.9 mg/dL (ref 0.0–1.9)

## 2023-05-06 NOTE — Telephone Encounter (Signed)
 Pt is willing to restart lipitor and will call to reschedule appointment

## 2023-05-07 ENCOUNTER — Other Ambulatory Visit: Payer: Self-pay | Admitting: Family Medicine

## 2023-05-07 DIAGNOSIS — E785 Hyperlipidemia, unspecified: Secondary | ICD-10-CM

## 2023-05-07 MED ORDER — ATORVASTATIN CALCIUM 10 MG PO TABS: 10.0000 mg | ORAL_TABLET | Freq: Every day | ORAL | 0 refills | Status: DC

## 2023-05-07 NOTE — Progress Notes (Signed)
 See lab notes

## 2023-05-19 ENCOUNTER — Encounter: Payer: Self-pay | Admitting: Family Medicine

## 2023-07-24 ENCOUNTER — Ambulatory Visit: Admitting: Family Medicine

## 2023-07-24 ENCOUNTER — Encounter: Payer: Self-pay | Admitting: Family Medicine

## 2023-07-24 VITALS — BP 126/78 | HR 80 | Temp 98.6°F | Ht 70.75 in | Wt 278.0 lb

## 2023-07-24 DIAGNOSIS — J4520 Mild intermittent asthma, uncomplicated: Secondary | ICD-10-CM | POA: Diagnosis not present

## 2023-07-24 DIAGNOSIS — E1169 Type 2 diabetes mellitus with other specified complication: Secondary | ICD-10-CM | POA: Diagnosis not present

## 2023-07-24 DIAGNOSIS — Z7984 Long term (current) use of oral hypoglycemic drugs: Secondary | ICD-10-CM

## 2023-07-24 DIAGNOSIS — E785 Hyperlipidemia, unspecified: Secondary | ICD-10-CM | POA: Diagnosis not present

## 2023-07-24 NOTE — Progress Notes (Signed)
 Subjective:  Patient ID: Antonio Kelley, male    DOB: 05-14-1990  Age: 33 y.o. MRN: [id number]  CC:  Chief Complaint  Patient presents with   Medical Management of Chronic Issues    Pt is well no concerns.     HPI [name] presents for   Diabetes: With hyperlipidemia.  Treated with metformin  500 mg daily.  At his last visit in February he had missed meds for a month but then on metformin  again for the previous week without any side effects or symptoms of hyperglycemia.  Home readings  - none.  No side effects with metformin . Forgets 1-2 days per week - weekends at times.  Microalbumin: normal in 05/05/23.  Optho, foot exam, pneumovax: utd.   Lab Results  Component Value Date   HGBA1C 7.5 (H) 05/05/2023   HGBA1C 6.8 (H) 11/08/2021   HGBA1C 7.6 (A) 12/13/2020   Lab Results  Component Value Date   MICROALBUR 0.9 05/05/2023   LDLCALC 149 (H) 05/05/2023   CREATININE 1.06 05/05/2023   Hyperlipidemia: Started on Lipitor in February, initially once per week with coq.10 supplement recommended to minimize risk of myalgias, then to increase up to daily as tolerated. Has been tolerated and up to daily dosing now. Did slowly titrate up to just weekly in past week. CoQ10 has been helpful.   Lab Results  Component Value Date   CHOL 237 (H) 05/05/2023   HDL 42.80 05/05/2023   LDLCALC 149 (H) 05/05/2023   LDLDIRECT 152.0 11/08/2021   TRIG 222.0 (H) 05/05/2023   CHOLHDL 6 05/05/2023   Lab Results  Component Value Date   ALT 26 05/05/2023   AST 20 05/05/2023   ALKPHOS 59 05/05/2023   BILITOT 0.6 05/05/2023    Mild intermittent asthma Discussed in February with option of intermittent need for albuterol , option of inhaled corticosteroid if frequent need. No recent need for albuterol .  Has taken allergy meds daily.    Health maintenance, he declines COVID-19 and pneumonia vaccines, declines HIV and hepatitis C screening.  History Patient Active Problem List   Diagnosis  Date Noted   Migraine without aura and without status migrainosus, not intractable 06/06/2017   Mild intermittent asthma with acute exacerbation 06/06/2017   Acute nonintractable headache 11/22/2016   Past Medical History:  Diagnosis Date   Allergy    Asthma    Hypertension    Past Surgical History:  Procedure Laterality Date   FINGER SURGERY     No Known Allergies Prior to Admission medications   Medication Sig Start Date End Date Taking? Authorizing Provider  albuterol  (VENTOLIN  HFA) 108 (90 Base) MCG/ACT inhaler Inhale 1-2 puffs into the lungs every 4 (four) hours as needed for wheezing or shortness of breath. 05/05/23  Yes Benjiman Bras, MD  atorvastatin  (LIPITOR) 10 MG tablet Take 1 tablet (10 mg total) by mouth daily. Initially start once per week and increase as tolerated. 05/07/23  Yes Benjiman Bras, MD  buPROPion  (WELLBUTRIN  XL) 300 MG 24 hr tablet Take 1 tablet (300 mg total) by mouth daily. 05/05/23  Yes Benjiman Bras, MD  metFORMIN  (GLUCOPHAGE ) 500 MG tablet Take 1 tablet (500 mg total) by mouth daily with breakfast. 05/05/23  Yes Benjiman Bras, MD   Social History   Socioeconomic History   Marital status: Significant Other    Spouse name: Not on file   Number of children: 1   Years of education: Not on file   Highest education level: Not on  file  Occupational History   Not on file  Tobacco Use   Smoking status: Former    Types: Cigarettes   Smokeless tobacco: Former  Building services engineer status: Every Day  Substance and Sexual Activity   Alcohol use: Yes    Alcohol/week: 0.0 standard drinks of alcohol    Comment: occ 3 times a year   Drug use: No   Sexual activity: Yes  Other Topics Concern   Not on file  Social History Narrative   Lives at home with his significant other   Caffeine: 1-2 cups daily   Social Drivers of Corporate investment banker Strain: Not on file  Food Insecurity: Not on file  Transportation Needs: Not on file   Physical Activity: Not on file  Stress: Not on file  Social Connections: Not on file  Intimate Partner Violence: Not on file    Review of Systems  Constitutional:  Negative for fatigue and unexpected weight change.  Eyes:  Negative for visual disturbance.  Respiratory:  Negative for cough, chest tightness and shortness of breath.   Cardiovascular:  Negative for chest pain, palpitations and leg swelling.  Gastrointestinal:  Negative for abdominal pain and blood in stool.  Neurological:  Negative for dizziness, light-headedness and headaches.     Objective:   Vitals:   07/24/23 1350  BP: 126/78  Pulse: 80  Temp: 98.6 F (37 C)  TempSrc: Temporal  SpO2: 98%  Weight: 278 lb (126.1 kg)  Height: 5' 10.75" (1.797 m)     Physical Exam Vitals reviewed.  Constitutional:      Appearance: He is well-developed.  HENT:     Head: Normocephalic and atraumatic.  Neck:     Vascular: No carotid bruit or JVD.  Cardiovascular:     Rate and Rhythm: Normal rate and regular rhythm.     Heart sounds: Normal heart sounds. No murmur heard. Pulmonary:     Effort: Pulmonary effort is normal.     Breath sounds: Normal breath sounds. No rales.  Musculoskeletal:     Right lower leg: No edema.     Left lower leg: No edema.  Skin:    General: Skin is warm and dry.  Neurological:     Mental Status: He is alert and oriented to person, place, and time.  Psychiatric:        Mood and Affect: Mood normal.        Assessment & Plan:  Antonio Kelley is a 33 y.o. male . Type 2 diabetes mellitus with hyperlipidemia (HCC) - Plan: Comprehensive metabolic panel with GFR, Hemoglobin A1c  - Tolerating metformin , check A1c and adjust plan accordingly.  No new meds for now  Hyperlipidemia, unspecified hyperlipidemia type - Plan: Comprehensive metabolic panel with GFR, Lipid panel  - Tolerating statin, check updated lipids.  If not quite at goal, he has been tapering dosage up to daily dosing, will  give it some more time before changing dose.  Mild intermittent asthma without complication  - Improved control, in part due to allergy treatment likely.  Albuterol  if needed, RTC precautions.  No orders of the defined types were placed in this encounter.  Patient Instructions  Lab visit in next few weeks follow up with me in 3 months, no med changes for now, take care .      Signed,   Caro Christmas, MD [facility], [facility] 07/24/23 2:07 PM

## 2023-07-24 NOTE — Patient Instructions (Signed)
 Lab visit in next few weeks follow up with me in 3 months, no med changes for now, take care .

## 2023-09-10 ENCOUNTER — Other Ambulatory Visit: Payer: Self-pay | Admitting: Family Medicine

## 2023-09-10 DIAGNOSIS — E785 Hyperlipidemia, unspecified: Secondary | ICD-10-CM

## 2023-09-10 NOTE — Telephone Encounter (Signed)
 Patient did have an appointment scheduled for 11/03/2023 for 6 month follow up but provider is out of office this day. Did ask patient if I can help reschedule this appointment and patient stated he would call back when he was able to check his work calendar.

## 2023-10-23 ENCOUNTER — Other Ambulatory Visit: Payer: Self-pay | Admitting: Family Medicine

## 2023-10-23 DIAGNOSIS — E785 Hyperlipidemia, unspecified: Secondary | ICD-10-CM

## 2023-11-03 ENCOUNTER — Ambulatory Visit: Payer: No Typology Code available for payment source | Admitting: Family Medicine

## 2023-11-09 ENCOUNTER — Other Ambulatory Visit: Payer: Self-pay | Admitting: Family Medicine

## 2023-11-09 DIAGNOSIS — F418 Other specified anxiety disorders: Secondary | ICD-10-CM

## 2023-12-13 ENCOUNTER — Other Ambulatory Visit: Payer: Self-pay | Admitting: Family Medicine

## 2023-12-13 DIAGNOSIS — E785 Hyperlipidemia, unspecified: Secondary | ICD-10-CM

## 2024-02-14 ENCOUNTER — Other Ambulatory Visit: Payer: Self-pay | Admitting: Family Medicine

## 2024-02-14 DIAGNOSIS — E785 Hyperlipidemia, unspecified: Secondary | ICD-10-CM

## 2024-02-14 DIAGNOSIS — E1169 Type 2 diabetes mellitus with other specified complication: Secondary | ICD-10-CM

## 2024-03-25 ENCOUNTER — Other Ambulatory Visit: Payer: Self-pay | Admitting: Family Medicine

## 2024-03-25 DIAGNOSIS — E785 Hyperlipidemia, unspecified: Secondary | ICD-10-CM
